# Patient Record
Sex: Male | Born: 1945 | Race: White | Hispanic: No | Smoking: Former smoker
Health system: Southern US, Community
[De-identification: ages and names within clinical notes are randomized; demographics above are authoritative.]

## PROBLEM LIST (undated history)

## (undated) DIAGNOSIS — R0600 Dyspnea, unspecified: Secondary | ICD-10-CM

## (undated) DIAGNOSIS — IMO0001 Reserved for inherently not codable concepts without codable children: Secondary | ICD-10-CM

## (undated) DIAGNOSIS — I2510 Atherosclerotic heart disease of native coronary artery without angina pectoris: Secondary | ICD-10-CM

## (undated) DIAGNOSIS — J449 Chronic obstructive pulmonary disease, unspecified: Secondary | ICD-10-CM

## (undated) DIAGNOSIS — I219 Acute myocardial infarction, unspecified: Secondary | ICD-10-CM

## (undated) DIAGNOSIS — E1169 Type 2 diabetes mellitus with other specified complication: Secondary | ICD-10-CM

## (undated) DIAGNOSIS — M5430 Sciatica, unspecified side: Secondary | ICD-10-CM

## (undated) DIAGNOSIS — I509 Heart failure, unspecified: Secondary | ICD-10-CM

## (undated) DIAGNOSIS — I10 Essential (primary) hypertension: Secondary | ICD-10-CM

## (undated) DIAGNOSIS — K219 Gastro-esophageal reflux disease without esophagitis: Secondary | ICD-10-CM

## (undated) DIAGNOSIS — R911 Solitary pulmonary nodule: Secondary | ICD-10-CM

## (undated) DIAGNOSIS — C3490 Malignant neoplasm of unspecified part of unspecified bronchus or lung: Secondary | ICD-10-CM

## (undated) DIAGNOSIS — J189 Pneumonia, unspecified organism: Secondary | ICD-10-CM

## (undated) DIAGNOSIS — W3400XA Accidental discharge from unspecified firearms or gun, initial encounter: Secondary | ICD-10-CM

## (undated) DIAGNOSIS — H539 Unspecified visual disturbance: Secondary | ICD-10-CM

## (undated) DIAGNOSIS — I499 Cardiac arrhythmia, unspecified: Secondary | ICD-10-CM

## (undated) DIAGNOSIS — R0609 Other forms of dyspnea: Secondary | ICD-10-CM

## (undated) DIAGNOSIS — E669 Obesity, unspecified: Secondary | ICD-10-CM

## (undated) DIAGNOSIS — G4730 Sleep apnea, unspecified: Secondary | ICD-10-CM

## (undated) DIAGNOSIS — I739 Peripheral vascular disease, unspecified: Secondary | ICD-10-CM

## (undated) DIAGNOSIS — M1990 Unspecified osteoarthritis, unspecified site: Secondary | ICD-10-CM

## (undated) DIAGNOSIS — R918 Other nonspecific abnormal finding of lung field: Secondary | ICD-10-CM

## (undated) DIAGNOSIS — Z9981 Dependence on supplemental oxygen: Secondary | ICD-10-CM

## (undated) DIAGNOSIS — E785 Hyperlipidemia, unspecified: Secondary | ICD-10-CM

## (undated) DIAGNOSIS — F419 Anxiety disorder, unspecified: Secondary | ICD-10-CM

## (undated) HISTORY — DX: Accidental discharge from unspecified firearms or gun, initial encounter: W34.00XA

## (undated) HISTORY — PX: CORONARY ARTERY BYPASS GRAFT: SHX141

## (undated) HISTORY — PX: OTHER SURGICAL HISTORY: SHX169

## (undated) HISTORY — DX: Unspecified visual disturbance: H53.9

## (undated) HISTORY — PX: COLON SURGERY: SHX602

## (undated) HISTORY — PX: EXPLORATORY LAPAROTOMY: SUR591

## (undated) HISTORY — PX: MANDIBLE FRACTURE SURGERY: SHX706

## (undated) HISTORY — DX: Other forms of dyspnea: R06.09

## (undated) HISTORY — DX: Malignant neoplasm of unspecified part of unspecified bronchus or lung: C34.90

## (undated) HISTORY — DX: Dyspnea, unspecified: R06.00

## (undated) HISTORY — PX: CORONARY ANGIOPLASTY: SHX604

## (undated) HISTORY — DX: Dependence on supplemental oxygen: Z99.81

## (undated) HISTORY — DX: Other nonspecific abnormal finding of lung field: R91.8

---

## 1967-05-22 DIAGNOSIS — W3400XA Accidental discharge from unspecified firearms or gun, initial encounter: Secondary | ICD-10-CM

## 1967-05-22 HISTORY — DX: Accidental discharge from unspecified firearms or gun, initial encounter: W34.00XA

## 2004-06-05 ENCOUNTER — Emergency Department: Payer: Self-pay | Admitting: Emergency Medicine

## 2005-07-05 ENCOUNTER — Other Ambulatory Visit: Payer: Self-pay

## 2005-07-05 ENCOUNTER — Emergency Department: Payer: Self-pay | Admitting: Emergency Medicine

## 2005-07-19 ENCOUNTER — Inpatient Hospital Stay: Payer: Self-pay | Admitting: Internal Medicine

## 2005-12-07 ENCOUNTER — Other Ambulatory Visit: Payer: Self-pay

## 2005-12-07 ENCOUNTER — Emergency Department: Payer: Self-pay | Admitting: Unknown Physician Specialty

## 2006-06-27 ENCOUNTER — Emergency Department: Payer: Self-pay | Admitting: General Practice

## 2006-08-16 ENCOUNTER — Encounter: Admission: RE | Admit: 2006-08-16 | Discharge: 2006-08-16 | Payer: Self-pay | Admitting: General Practice

## 2007-03-17 ENCOUNTER — Other Ambulatory Visit: Payer: Self-pay

## 2007-03-17 ENCOUNTER — Emergency Department: Payer: Self-pay | Admitting: Emergency Medicine

## 2014-10-14 ENCOUNTER — Ambulatory Visit: Payer: Medicare Other

## 2015-06-11 DIAGNOSIS — J189 Pneumonia, unspecified organism: Secondary | ICD-10-CM

## 2015-06-11 HISTORY — DX: Pneumonia, unspecified organism: J18.9

## 2015-07-17 ENCOUNTER — Encounter: Payer: Self-pay | Admitting: *Deleted

## 2015-07-17 ENCOUNTER — Emergency Department: Payer: Medicare Other

## 2015-07-17 ENCOUNTER — Inpatient Hospital Stay
Admission: EM | Admit: 2015-07-17 | Discharge: 2015-07-20 | DRG: 190 | Disposition: A | Discharge status: Home / Self Care | Payer: Medicare Other | Attending: Internal Medicine | Admitting: Internal Medicine

## 2015-07-17 DIAGNOSIS — I739 Peripheral vascular disease, unspecified: Secondary | ICD-10-CM | POA: Diagnosis present

## 2015-07-17 DIAGNOSIS — Z789 Other specified health status: Secondary | ICD-10-CM

## 2015-07-17 DIAGNOSIS — I10 Essential (primary) hypertension: Secondary | ICD-10-CM | POA: Diagnosis present

## 2015-07-17 DIAGNOSIS — Z7984 Long term (current) use of oral hypoglycemic drugs: Secondary | ICD-10-CM

## 2015-07-17 DIAGNOSIS — Z7951 Long term (current) use of inhaled steroids: Secondary | ICD-10-CM

## 2015-07-17 DIAGNOSIS — Z7952 Long term (current) use of systemic steroids: Secondary | ICD-10-CM

## 2015-07-17 DIAGNOSIS — E1151 Type 2 diabetes mellitus with diabetic peripheral angiopathy without gangrene: Secondary | ICD-10-CM | POA: Diagnosis present

## 2015-07-17 DIAGNOSIS — J440 Chronic obstructive pulmonary disease with acute lower respiratory infection: Principal | ICD-10-CM | POA: Diagnosis present

## 2015-07-17 DIAGNOSIS — R0682 Tachypnea, not elsewhere classified: Secondary | ICD-10-CM | POA: Diagnosis present

## 2015-07-17 DIAGNOSIS — E669 Obesity, unspecified: Secondary | ICD-10-CM | POA: Diagnosis present

## 2015-07-17 DIAGNOSIS — Z79899 Other long term (current) drug therapy: Secondary | ICD-10-CM

## 2015-07-17 DIAGNOSIS — I2510 Atherosclerotic heart disease of native coronary artery without angina pectoris: Secondary | ICD-10-CM | POA: Diagnosis present

## 2015-07-17 DIAGNOSIS — Z87891 Personal history of nicotine dependence: Secondary | ICD-10-CM

## 2015-07-17 DIAGNOSIS — Z6825 Body mass index (BMI) 25.0-25.9, adult: Secondary | ICD-10-CM

## 2015-07-17 DIAGNOSIS — Z888 Allergy status to other drugs, medicaments and biological substances status: Secondary | ICD-10-CM

## 2015-07-17 DIAGNOSIS — Z7982 Long term (current) use of aspirin: Secondary | ICD-10-CM

## 2015-07-17 DIAGNOSIS — R911 Solitary pulmonary nodule: Secondary | ICD-10-CM | POA: Diagnosis present

## 2015-07-17 DIAGNOSIS — J189 Pneumonia, unspecified organism: Secondary | ICD-10-CM | POA: Diagnosis present

## 2015-07-17 DIAGNOSIS — G8929 Other chronic pain: Secondary | ICD-10-CM | POA: Diagnosis present

## 2015-07-17 DIAGNOSIS — Z7902 Long term (current) use of antithrombotics/antiplatelets: Secondary | ICD-10-CM

## 2015-07-17 DIAGNOSIS — J441 Chronic obstructive pulmonary disease with (acute) exacerbation: Secondary | ICD-10-CM | POA: Diagnosis present

## 2015-07-17 DIAGNOSIS — Z951 Presence of aortocoronary bypass graft: Secondary | ICD-10-CM

## 2015-07-17 DIAGNOSIS — M5430 Sciatica, unspecified side: Secondary | ICD-10-CM | POA: Diagnosis present

## 2015-07-17 DIAGNOSIS — E785 Hyperlipidemia, unspecified: Secondary | ICD-10-CM | POA: Diagnosis present

## 2015-07-17 HISTORY — DX: Sciatica, unspecified side: M54.30

## 2015-07-17 HISTORY — DX: Peripheral vascular disease, unspecified: I73.9

## 2015-07-17 HISTORY — DX: Type 2 diabetes mellitus with other specified complication: E11.69

## 2015-07-17 HISTORY — DX: Essential (primary) hypertension: I10

## 2015-07-17 HISTORY — DX: Atherosclerotic heart disease of native coronary artery without angina pectoris: I25.10

## 2015-07-17 HISTORY — DX: Obesity, unspecified: E66.9

## 2015-07-17 HISTORY — DX: Chronic obstructive pulmonary disease, unspecified: J44.9

## 2015-07-17 LAB — BASIC METABOLIC PANEL
Anion gap: 9 (ref 5–15)
BUN: 24 mg/dL — AB (ref 6–20)
CALCIUM: 9.6 mg/dL (ref 8.9–10.3)
CO2: 26 mmol/L (ref 22–32)
Chloride: 104 mmol/L (ref 101–111)
Creatinine, Ser: 1.17 mg/dL (ref 0.61–1.24)
GFR calc Af Amer: >60 mL/min (ref >60)
Glucose, Bld: 124 mg/dL — ABNORMAL HIGH (ref 65–99)
POTASSIUM: 5.1 mmol/L (ref 3.5–5.1)
SODIUM: 139 mmol/L (ref 135–145)

## 2015-07-17 LAB — CBC
HCT: 45.5 % (ref 40.0–52.0)
HEMOGLOBIN: 15.1 g/dL (ref 13.0–18.0)
MCH: 30.4 pg (ref 26.0–34.0)
MCHC: 33.2 g/dL (ref 32.0–36.0)
MCV: 91.7 fL (ref 80.0–100.0)
PLATELETS: 286 10*3/uL (ref 150–440)
RBC: 4.96 MIL/uL (ref 4.40–5.90)
RDW: 15.3 % — AB (ref 11.5–14.5)
WBC: 6.5 10*3/uL (ref 3.8–10.6)

## 2015-07-17 LAB — TROPONIN I

## 2015-07-17 MED ORDER — PIPERACILLIN-TAZOBACTAM 3.375 G IVPB
3.3750 g | Freq: Once | INTRAVENOUS | Status: AC
  Administered 2015-07-18: 3.375 g via INTRAVENOUS
  Filled 2015-07-17: qty 50

## 2015-07-17 MED ORDER — METHYLPREDNISOLONE SODIUM SUCC 125 MG IJ SOLR
125.0000 mg | INTRAMUSCULAR | Status: AC
  Administered 2015-07-17: 125 mg via INTRAVENOUS
  Filled 2015-07-17: qty 2

## 2015-07-17 MED ORDER — IPRATROPIUM-ALBUTEROL 0.5-2.5 (3) MG/3ML IN SOLN
3.0000 mL | Freq: Once | RESPIRATORY_TRACT | Status: AC
  Administered 2015-07-17: 3 mL via RESPIRATORY_TRACT
  Filled 2015-07-17: qty 3

## 2015-07-17 MED ORDER — SODIUM CHLORIDE 0.9 % IV BOLUS (SEPSIS)
1000.0000 mL | Freq: Once | INTRAVENOUS | Status: AC
  Administered 2015-07-17: 1000 mL via INTRAVENOUS

## 2015-07-17 NOTE — ED Notes (Signed)
Pt reports shortness of breath since jan 21st.  Pt had recent pneumonia and took antibiodics x 2.  Pt reports intermittent chest pain.  Pt has nausea.    Pt alert .   Speech clear.

## 2015-07-17 NOTE — ED Provider Notes (Signed)
[facility] Emergency Department Provider Note  ____________________________________________  Time seen: Approximately 11:30 PM  I have reviewed the triage vital signs and the nursing notes.   HISTORY  Chief Complaint Shortness of Breath    HPI Jared Jefferson is a 70 y.o. male history of COPD, initially treated on 3 or 4 antibiotics for persistent cough, shortness of breath and chills and feeling warm in the evenings. Patient is unsure if he's had a fever.  Patient reports that about a month to a*with a cough while at fishing tournament in [place]. He was seen and placed on antibiotic, and follow up with his primary is placed on doxycycline, then went to the [facility] emergency room and was placed on another antibiotic which she doesn't remember the name of. He reports he took a full course of medicine he is originally given as well as doxycycline.  Continues to feel short of breath, wheezing, coughing productive at times. Occasionally feels warm and so he has a fever.  Denies chest pain. No pain with deep inspiration.  No past medical history on file.  There are no active problems to display for this patient.   No past surgical history on file.  Current Outpatient Rx  Name  Route  Sig  Dispense  Refill  . albuterol (PROVENTIL HFA;VENTOLIN HFA) 108 (90 Base) MCG/ACT inhaler   Inhalation   Inhale 1-2 puffs into the lungs every 6 (six) hours as needed for wheezing or shortness of breath.         . ALPRAZolam (XANAX) 1 MG tablet   Oral   Take 1 mg by mouth 3 (three) times daily as needed for anxiety or sleep.         Marland Kitchen aspirin EC 81 MG tablet   Oral   Take 81 mg by mouth daily.         Marland Kitchen atorvastatin (LIPITOR) 40 MG tablet   Oral   Take 40 mg by mouth daily.         . budesonide-formoterol (SYMBICORT) 160-4.5 MCG/ACT inhaler   Inhalation   Inhale 2 puffs into the lungs 2 (two) times daily.         . carisoprodol (SOMA) 350 MG tablet    Oral   Take 350 mg by mouth 3 (three) times daily.         . cilostazol (PLETAL) 50 MG tablet   Oral   Take 50 mg by mouth 2 (two) times daily.         . clopidogrel (PLAVIX) 75 MG tablet   Oral   Take 75 mg by mouth daily.         . diphenhydrAMINE (BENADRYL) 25 mg capsule   Oral   Take 75 mg by mouth at bedtime as needed for sleep.         . fexofenadine (ALLEGRA) 180 MG tablet   Oral   Take 180 mg by mouth daily.         Marland Kitchen HYDROcodone-acetaminophen (NORCO) 10-325 MG tablet   Oral   Take 1 tablet by mouth every 6 (six) hours as needed for moderate pain.         Marland Kitchen lisinopril (PRINIVIL,ZESTRIL) 10 MG tablet   Oral   Take 10 mg by mouth daily.         . meloxicam (MOBIC) 15 MG tablet   Oral   Take 15 mg by mouth daily.         . metoprolol succinate (  TOPROL-XL) 100 MG 24 hr tablet   Oral   Take 100 mg by mouth daily. Take with or immediately following a meal.         . mometasone-formoterol (DULERA) 100-5 MCG/ACT AERO   Inhalation   Inhale 2 puffs into the lungs 2 (two) times daily.         . nitroGLYCERIN (NITROSTAT) 0.4 MG SL tablet   Sublingual   Place 0.4 mg under the tongue every 5 (five) minutes as needed for chest pain.         . pioglitazone-metformin (ACTOPLUS MET) 15-850 MG tablet   Oral   Take 1 tablet by mouth 2 (two) times daily with a meal.           Allergies Iohexol  No family history on file.  Social History Social History  Substance Use Topics  . Smoking status: Former Research scientist (life sciences)  . Smokeless tobacco: Not on file  . Alcohol Use: No    Review of Systems Constitutional: History of present illness Eyes: No visual changes. ENT: No sore throat. Cardiovascular: Denies chest pain. Respiratory: History of present illness Gastrointestinal: No abdominal pain.  No nausea, no vomiting.  No diarrhea.  No constipation. Genitourinary: Negative for dysuria. Musculoskeletal: Negative for back pain. Skin: Negative for  rash. Neurological: Negative for headaches, focal weakness or numbness.  10-point ROS otherwise negative.  ____________________________________________   PHYSICAL EXAM:  VITAL SIGNS: ED Triage Vitals  Enc Vitals Group     BP 07/17/15 2002 130/71 mmHg     Pulse Rate 07/17/15 2002 52     Resp 07/17/15 2002 20     Temp 07/17/15 2002 98 F (36.7 C)     Temp Source 07/17/15 2002 Oral     SpO2 07/17/15 2002 94 %     Weight 07/17/15 2002 180 lb (81.647 kg)     Height 07/17/15 2002 _0  (1.778 m)     Head Cir --      Peak Flow --      Pain Score 07/17/15 2003 8     Pain Loc --      Pain Edu? --      Excl. in [place]? --    Constitutional: Alert and oriented. Well appearing and in no acute distress. Eyes: Conjunctivae are normal. PERRL. EOMI. Head: Atraumatic. Nose: No congestion/rhinnorhea. Mouth/Throat: Mucous membranes are moist.  Oropharynx non-erythematous. Neck: No stridor.   Cardiovascular: Normal rate, regular rhythm. Grossly normal heart sounds.  Good peripheral circulation. Respiratory: Frequent dry cough. Occasionally audible wheezing with speaking. Speaks in phrases. Mild use of accessory muscles, and expiratory wheezing heard in all lobes. No significant distress other than mild increased work of breathing is noted at this time. Gastrointestinal: Soft and nontender. No distention. No abdominal bruits. No CVA tenderness. Musculoskeletal: No lower extremity tenderness nor edema.  Neurologic:  Normal speech and language. No gross focal neurologic deficits are appreciated.  Skin:  Skin is warm, dry and intact. No rash noted. Psychiatric: Mood and affect are normal. Speech and behavior are normal.  ____________________________________________   LABS (all labs ordered are listed, but only abnormal results are displayed)  Labs Reviewed  BASIC METABOLIC PANEL - Abnormal; Notable for the following:    Glucose, Bld 124 (*)    BUN 24 (*)    All other components within normal  limits  CBC - Abnormal; Notable for the following:    RDW 15.3 (*)    All other components within normal limits  CULTURE,  BLOOD (ROUTINE X 2)  CULTURE, BLOOD (ROUTINE X 2)  TROPONIN I   ____________________________________________  EKG  Reviewed and interpreted by me at 2015 Ventricular rate 70 QRS 105 QTc 420 Irregular rhythm, but appears to be normal sinus without ischemic change. Normal T waves. Patient sinus arrhythmia. ____________________________________________  RADIOLOGY  CT Chest Wo Contrast (Final result) Result time: 07/17/15 22:15:51   Final result by Rad Results In Interface (07/17/15 22:15:51)   Narrative:   CLINICAL DATA: 70 year old male with shortness of breath. History of recent pneumonia. Patient reports intubated chest pain.  EXAM: CT CHEST WITHOUT CONTRAST  TECHNIQUE: Multidetector CT imaging of the chest was performed following the standard protocol without IV contrast.  COMPARISON: Chest radiograph dated 07/17/2015  FINDINGS: There is emphysematous changes of the lungs. There is diffuse interstitial prominence involving the entire right lung concerning for pneumonia. There is a 9 mm nodular density in the right upper lobe posteriorly (series 2, image 16). The left lung is clear. There is no pleural effusion or pneumothorax. The central airways are patent. There is narrowing of the transverse diameter of the trachea (saber sheath trachea) compatible with chronic obstructive lung disease. There is no hilar or mediastinal adenopathy. There is atherosclerotic calcification of the thoracic aorta. There is no cardiomegaly or pericardial effusion. Advanced coronary vascular calcification as well as CABG changes. Esophagus is grossly unremarkable.  There is no axillary adenopathy. The chest wall soft tissues appear unremarkable. Median sternotomy wires. There is degenerative changes of the spine. No acute fracture.  There is layering small  stones within the gallbladder. No pericholecystic fluid. The visualized upper abdomen is otherwise unremarkable.  IMPRESSION: Emphysema with diffuse interstitial thickening of the right lung concerning for pneumonia.  A 9 mm right upper lobe pulmonary nodule. As per Fleischner Society criteria Follow-up with CT at 3, 9, and 24 months or dynamic contrast-enhanced CT, PET, and/or biopsy recommended.   Electronically Signed By: Anner Crete M.D. On: 07/17/2015 22:15    ____________________________________________   PROCEDURES  Procedure(s) performed: None  Critical Care performed: No  ____________________________________________   INITIAL IMPRESSION / ASSESSMENT AND PLAN / ED COURSE  Pertinent labs & imaging results that were available during my care of the patient were reviewed by me and considered in my medical decision making (see chart for details).  Patient transfer persistent cough, wheezing, and productive cough not improving after at least 2 focal courses of antibiotics in the last month.  His labs are reassuring but CT does demonstrate a persistent infiltrate in given his cough, wheezing, and infectious symptoms is possible he has a resistant infection. Alternative considerations would certainly include pulmonary embolism, the patient has IV dye allergy. He has no persistent or pleuritic pain. Denies chest pain. No previous history of any blood clots. No leg swelling or symptoms to suggest obvious DVT. Based on his current presentation feel warranted to admit for IV antibiotics, close follow-up and pulmonary consultation at this time. Patient is agreeable.   ____________________________________________   FINAL CLINICAL IMPRESSION(S) / ED DIAGNOSES  Final diagnoses:  Community acquired pneumonia  Failure of outpatient treatment  COPD exacerbation ([place])      Delman Kitten, MD 07/17/15 2335

## 2015-07-18 ENCOUNTER — Encounter: Payer: Self-pay | Admitting: Internal Medicine

## 2015-07-18 DIAGNOSIS — I739 Peripheral vascular disease, unspecified: Secondary | ICD-10-CM | POA: Diagnosis present

## 2015-07-18 DIAGNOSIS — I2510 Atherosclerotic heart disease of native coronary artery without angina pectoris: Secondary | ICD-10-CM | POA: Diagnosis present

## 2015-07-18 DIAGNOSIS — Z888 Allergy status to other drugs, medicaments and biological substances status: Secondary | ICD-10-CM | POA: Diagnosis not present

## 2015-07-18 DIAGNOSIS — Z7984 Long term (current) use of oral hypoglycemic drugs: Secondary | ICD-10-CM | POA: Diagnosis not present

## 2015-07-18 DIAGNOSIS — R911 Solitary pulmonary nodule: Secondary | ICD-10-CM | POA: Diagnosis present

## 2015-07-18 DIAGNOSIS — J441 Chronic obstructive pulmonary disease with (acute) exacerbation: Secondary | ICD-10-CM | POA: Diagnosis present

## 2015-07-18 DIAGNOSIS — Z87891 Personal history of nicotine dependence: Secondary | ICD-10-CM | POA: Diagnosis not present

## 2015-07-18 DIAGNOSIS — Z7982 Long term (current) use of aspirin: Secondary | ICD-10-CM | POA: Diagnosis not present

## 2015-07-18 DIAGNOSIS — E669 Obesity, unspecified: Secondary | ICD-10-CM | POA: Diagnosis present

## 2015-07-18 DIAGNOSIS — R0682 Tachypnea, not elsewhere classified: Secondary | ICD-10-CM | POA: Diagnosis present

## 2015-07-18 DIAGNOSIS — J189 Pneumonia, unspecified organism: Secondary | ICD-10-CM | POA: Diagnosis present

## 2015-07-18 DIAGNOSIS — Z7902 Long term (current) use of antithrombotics/antiplatelets: Secondary | ICD-10-CM | POA: Diagnosis not present

## 2015-07-18 DIAGNOSIS — Z951 Presence of aortocoronary bypass graft: Secondary | ICD-10-CM | POA: Diagnosis not present

## 2015-07-18 DIAGNOSIS — I10 Essential (primary) hypertension: Secondary | ICD-10-CM | POA: Diagnosis present

## 2015-07-18 DIAGNOSIS — Z7951 Long term (current) use of inhaled steroids: Secondary | ICD-10-CM | POA: Diagnosis not present

## 2015-07-18 DIAGNOSIS — Z79899 Other long term (current) drug therapy: Secondary | ICD-10-CM | POA: Diagnosis not present

## 2015-07-18 DIAGNOSIS — J440 Chronic obstructive pulmonary disease with acute lower respiratory infection: Secondary | ICD-10-CM | POA: Diagnosis present

## 2015-07-18 DIAGNOSIS — E785 Hyperlipidemia, unspecified: Secondary | ICD-10-CM | POA: Diagnosis present

## 2015-07-18 DIAGNOSIS — Z7952 Long term (current) use of systemic steroids: Secondary | ICD-10-CM | POA: Diagnosis not present

## 2015-07-18 DIAGNOSIS — E1151 Type 2 diabetes mellitus with diabetic peripheral angiopathy without gangrene: Secondary | ICD-10-CM | POA: Diagnosis present

## 2015-07-18 DIAGNOSIS — Z6825 Body mass index (BMI) 25.0-25.9, adult: Secondary | ICD-10-CM | POA: Diagnosis not present

## 2015-07-18 DIAGNOSIS — M5430 Sciatica, unspecified side: Secondary | ICD-10-CM | POA: Diagnosis present

## 2015-07-18 DIAGNOSIS — G8929 Other chronic pain: Secondary | ICD-10-CM | POA: Diagnosis present

## 2015-07-18 LAB — GLUCOSE, CAPILLARY
Glucose-Capillary: 142 mg/dL — ABNORMAL HIGH (ref 65–99)
Glucose-Capillary: 191 mg/dL — ABNORMAL HIGH (ref 65–99)
Glucose-Capillary: 197 mg/dL — ABNORMAL HIGH (ref 65–99)
Glucose-Capillary: 143 mg/dL — ABNORMAL HIGH (ref 65–99)
Glucose-Capillary: 224 mg/dL — ABNORMAL HIGH (ref 65–99)

## 2015-07-18 LAB — HEMOGLOBIN A1C: Hgb A1c MFr Bld: 6.5 % — ABNORMAL HIGH (ref 4.0–6.0)

## 2015-07-18 LAB — TSH: TSH: 2.357 u[IU]/mL (ref 0.350–4.500)

## 2015-07-18 MED ORDER — LORATADINE 10 MG PO TABS
10.0000 mg | ORAL_TABLET | Freq: Every day | ORAL | Status: DC
  Administered 2015-07-18 – 2015-07-20 (×3): 10 mg via ORAL
  Filled 2015-07-18 (×3): qty 1

## 2015-07-18 MED ORDER — NITROGLYCERIN 0.4 MG SL SUBL: 0.4000 mg | SUBLINGUAL_TABLET | SUBLINGUAL | Status: DC | PRN

## 2015-07-18 MED ORDER — ALPRAZOLAM 0.5 MG PO TABS
1.0000 mg | ORAL_TABLET | Freq: Three times a day (TID) | ORAL | Status: DC | PRN
Start: 2015-07-18 — End: 2015-07-20
  Administered 2015-07-18 – 2015-07-20 (×3): 1 mg via ORAL
  Filled 2015-07-18: qty 1
  Filled 2015-07-18 (×2): qty 2
  Filled 2015-07-18: qty 1

## 2015-07-18 MED ORDER — TIOTROPIUM BROMIDE MONOHYDRATE 18 MCG IN CAPS
18.0000 ug | ORAL_CAPSULE | Freq: Every day | RESPIRATORY_TRACT | Status: DC
  Administered 2015-07-18 – 2015-07-20 (×3): 18 ug via RESPIRATORY_TRACT
  Filled 2015-07-18: qty 5

## 2015-07-18 MED ORDER — INSULIN ASPART 100 UNIT/ML ~~LOC~~ SOLN
0.0000 [IU] | Freq: Every day | SUBCUTANEOUS | Status: DC
  Administered 2015-07-18: 2 [IU] via SUBCUTANEOUS
  Filled 2015-07-18: qty 2

## 2015-07-18 MED ORDER — ENOXAPARIN SODIUM 40 MG/0.4ML ~~LOC~~ SOLN
40.0000 mg | SUBCUTANEOUS | Status: DC
  Administered 2015-07-18 – 2015-07-19 (×2): 40 mg via SUBCUTANEOUS
  Filled 2015-07-18 (×2): qty 0.4

## 2015-07-18 MED ORDER — CARISOPRODOL 350 MG PO TABS
350.0000 mg | ORAL_TABLET | Freq: Three times a day (TID) | ORAL | Status: DC
  Administered 2015-07-18 – 2015-07-20 (×7): 350 mg via ORAL
  Filled 2015-07-18 (×7): qty 1

## 2015-07-18 MED ORDER — MELOXICAM 7.5 MG PO TABS
15.0000 mg | ORAL_TABLET | Freq: Every day | ORAL | Status: DC
  Administered 2015-07-18 – 2015-07-20 (×3): 15 mg via ORAL
  Filled 2015-07-18 (×3): qty 2

## 2015-07-18 MED ORDER — PREDNISONE 20 MG PO TABS: 20.0000 mg | ORAL_TABLET | Freq: Every day | ORAL | Status: DC

## 2015-07-18 MED ORDER — MORPHINE SULFATE (PF) 2 MG/ML IV SOLN: 2.0000 mg | INTRAVENOUS | Status: DC | PRN

## 2015-07-18 MED ORDER — HEPARIN SODIUM (PORCINE) 5000 UNIT/ML IJ SOLN
5000.0000 [IU] | Freq: Three times a day (TID) | INTRAMUSCULAR | Status: DC
  Administered 2015-07-18: 5000 [IU] via SUBCUTANEOUS
  Filled 2015-07-18: qty 1

## 2015-07-18 MED ORDER — DOCUSATE SODIUM 100 MG PO CAPS
100.0000 mg | ORAL_CAPSULE | Freq: Two times a day (BID) | ORAL | Status: DC
  Administered 2015-07-18 – 2015-07-20 (×6): 100 mg via ORAL
  Filled 2015-07-18 (×6): qty 1

## 2015-07-18 MED ORDER — PREDNISONE 50 MG PO TABS
50.0000 mg | ORAL_TABLET | Freq: Every day | ORAL | Status: AC
  Administered 2015-07-18: 50 mg via ORAL
  Filled 2015-07-18: qty 1

## 2015-07-18 MED ORDER — CILOSTAZOL 100 MG PO TABS
50.0000 mg | ORAL_TABLET | Freq: Two times a day (BID) | ORAL | Status: DC
  Administered 2015-07-18 – 2015-07-20 (×6): 50 mg via ORAL
  Filled 2015-07-18: qty 2
  Filled 2015-07-18 (×5): qty 1

## 2015-07-18 MED ORDER — METOPROLOL SUCCINATE ER 100 MG PO TB24
100.0000 mg | ORAL_TABLET | Freq: Every day | ORAL | Status: DC
  Administered 2015-07-18 – 2015-07-20 (×3): 100 mg via ORAL
  Filled 2015-07-18 (×3): qty 1

## 2015-07-18 MED ORDER — INSULIN ASPART 100 UNIT/ML ~~LOC~~ SOLN
0.0000 [IU] | Freq: Three times a day (TID) | SUBCUTANEOUS | Status: DC
  Administered 2015-07-18 (×2): 2 [IU] via SUBCUTANEOUS
  Administered 2015-07-18: 1 [IU] via SUBCUTANEOUS
  Administered 2015-07-19: 3 [IU] via SUBCUTANEOUS
  Administered 2015-07-19: 1 [IU] via SUBCUTANEOUS
  Administered 2015-07-19 – 2015-07-20 (×3): 2 [IU] via SUBCUTANEOUS
  Filled 2015-07-18: qty 3
  Filled 2015-07-18: qty 1
  Filled 2015-07-18 (×3): qty 2
  Filled 2015-07-18: qty 1
  Filled 2015-07-18 (×2): qty 2

## 2015-07-18 MED ORDER — ATORVASTATIN CALCIUM 20 MG PO TABS
40.0000 mg | ORAL_TABLET | Freq: Every day | ORAL | Status: DC
  Administered 2015-07-18 – 2015-07-20 (×3): 40 mg via ORAL
  Filled 2015-07-18 (×3): qty 2

## 2015-07-18 MED ORDER — PREDNISONE 20 MG PO TABS: 10.0000 mg | ORAL_TABLET | Freq: Every day | ORAL | Status: DC

## 2015-07-18 MED ORDER — PREDNISONE 20 MG PO TABS
40.0000 mg | ORAL_TABLET | Freq: Every day | ORAL | Status: AC
  Administered 2015-07-19: 40 mg via ORAL
  Filled 2015-07-18: qty 2

## 2015-07-18 MED ORDER — PREDNISONE 20 MG PO TABS
30.0000 mg | ORAL_TABLET | Freq: Every day | ORAL | Status: AC
  Administered 2015-07-20: 30 mg via ORAL
  Filled 2015-07-18: qty 2

## 2015-07-18 MED ORDER — ALBUTEROL SULFATE (2.5 MG/3ML) 0.083% IN NEBU: 3.0000 mL | INHALATION_SOLUTION | RESPIRATORY_TRACT | Status: DC | PRN

## 2015-07-18 MED ORDER — PREDNISONE 5 MG PO TABS: 5.0000 mg | ORAL_TABLET | Freq: Every day | ORAL | Status: DC

## 2015-07-18 MED ORDER — ASPIRIN EC 81 MG PO TBEC
81.0000 mg | DELAYED_RELEASE_TABLET | Freq: Every day | ORAL | Status: DC
  Administered 2015-07-18 – 2015-07-20 (×3): 81 mg via ORAL
  Filled 2015-07-18 (×3): qty 1

## 2015-07-18 MED ORDER — ONDANSETRON HCL 4 MG PO TABS: 4.0000 mg | ORAL_TABLET | Freq: Four times a day (QID) | ORAL | Status: DC | PRN

## 2015-07-18 MED ORDER — CLOPIDOGREL BISULFATE 75 MG PO TABS
75.0000 mg | ORAL_TABLET | Freq: Every day | ORAL | Status: DC
Start: 2015-07-18 — End: 2015-07-20
  Administered 2015-07-18 – 2015-07-20 (×3): 75 mg via ORAL
  Filled 2015-07-18 (×3): qty 1

## 2015-07-18 MED ORDER — ONDANSETRON HCL 4 MG/2ML IJ SOLN
4.0000 mg | Freq: Four times a day (QID) | INTRAMUSCULAR | Status: DC | PRN
Start: 2015-07-18 — End: 2015-07-20

## 2015-07-18 MED ORDER — MOMETASONE FURO-FORMOTEROL FUM 100-5 MCG/ACT IN AERO: 2.0000 | INHALATION_SPRAY | Freq: Two times a day (BID) | RESPIRATORY_TRACT | Status: DC

## 2015-07-18 MED ORDER — PIPERACILLIN-TAZOBACTAM 3.375 G IVPB
3.3750 g | Freq: Three times a day (TID) | INTRAVENOUS | Status: DC
  Administered 2015-07-18 – 2015-07-20 (×6): 3.375 g via INTRAVENOUS
  Filled 2015-07-18 (×10): qty 50

## 2015-07-18 MED ORDER — MOMETASONE FURO-FORMOTEROL FUM 200-5 MCG/ACT IN AERO
2.0000 | INHALATION_SPRAY | Freq: Two times a day (BID) | RESPIRATORY_TRACT | Status: DC
  Administered 2015-07-18 – 2015-07-19 (×2): 2 via RESPIRATORY_TRACT
  Filled 2015-07-18: qty 8.8

## 2015-07-18 MED ORDER — DIPHENHYDRAMINE HCL 25 MG PO CAPS: 75.0000 mg | ORAL_CAPSULE | Freq: Every evening | ORAL | Status: DC | PRN

## 2015-07-18 MED ORDER — ACETAMINOPHEN 325 MG PO TABS: 650.0000 mg | ORAL_TABLET | Freq: Four times a day (QID) | ORAL | Status: DC | PRN

## 2015-07-18 MED ORDER — HYDROCODONE-ACETAMINOPHEN 10-325 MG PO TABS
1.0000 | ORAL_TABLET | Freq: Four times a day (QID) | ORAL | Status: DC | PRN
  Administered 2015-07-18 – 2015-07-20 (×7): 1 via ORAL
  Filled 2015-07-18 (×8): qty 1

## 2015-07-18 MED ORDER — LISINOPRIL 10 MG PO TABS
10.0000 mg | ORAL_TABLET | Freq: Every day | ORAL | Status: DC
  Administered 2015-07-18: 10 mg via ORAL
  Filled 2015-07-18: qty 1

## 2015-07-18 MED ORDER — ACETAMINOPHEN 650 MG RE SUPP: 650.0000 mg | Freq: Four times a day (QID) | RECTAL | Status: DC | PRN

## 2015-07-18 NOTE — Progress Notes (Signed)
Pharmacy Antibiotic Note  Jared Jefferson is a 70 y.o. male admitted on 07/17/2015 with pneumonia.  Pharmacy has been consulted for Zosyn dosing.  Plan: Zosyn 3.375g IV q8h (4 hour infusion).  Height: '5\' 10"'$  (177.8 cm) Weight: 173 lb 9.6 oz (78.744 kg) IBW/kg (Calculated) : 73  Temp (24hrs), Avg:97.8 F (36.6 C), Min:97.3 F (36.3 C), Max:98 F (36.7 C)   Recent Labs Lab 07/17/15 2006  WBC 6.5  CREATININE 1.17    Estimated Creatinine Clearance: 61.5 mL/min (by C-G formula based on Cr of 1.17).    Allergies  Allergen Reactions  . Iohexol Anaphylaxis    Antimicrobials this admission: Zosyn 2/26>>   Microbiology results: 2/26 BCx: pending  Thank you for allowing pharmacy to be a part of this patient's care.  Chastelyn Athens G 07/18/2015 11:25 AM

## 2015-07-18 NOTE — Care Management Note (Signed)
Case Management Note  Patient Details  Name: YAEL ANGERER MRN: [id number] Date of Birth: [birth year]  Subjective/Objective:                  Met with patient to discuss discharge planning. He states that he is from home alone where he is independent with daily activities- he drives. He states his cousins are his only kin in the area and one is named [name] that is a Research scientist (medical) here with [facility]. He states he recently bought a friends mobile home that came with O2 tanks, concentrator, O2 that can be carried on your shoulder, nebulizer, and hospital bed although he states he is not using O2 as it has not been prescribed. His PCP is Dr. Petra Kuba in [place]. His pharmacy is [facility]. He denies difficulty obtaining Rx.   Action/Plan:   No current RNCM needs.   Expected Discharge Date:                  Expected Discharge Plan:     In-House Referral:     Discharge planning Services  CM Consult  Post Acute Care Choice:    Choice offered to:  Patient  DME Arranged:    DME Agency:     HH Arranged:    [place] Agency:     Status of Service:  Completed, signed off  Medicare Important Message Given:    Date Medicare IM Given:    Medicare IM give by:    Date Additional Medicare IM Given:    Additional Medicare Important Message give by:     If discussed at [place] of Stay Meetings, dates discussed:    Additional Comments:  [name], RN 07/18/2015, 12:51 PM

## 2015-07-18 NOTE — H&P (Signed)
Jared Jefferson is an 70 y.o. male.   Chief Complaint: Shortness of breath HPI: The patient presents to the emergency department complaining of cough, wheezing, and dyspnea.  He has had these symptoms for 1 month.  He has completed 3 rounds of antibiotics (and 1 very long steroid taper) without significant relief.  Upon arrival, his oxygen saturations were normal on room air but he was tachypneic.  Chest xray showed acute on chronic right middle lobe pneumonia.  Due to failed outpatient treatment the emergency department staff called for admission.  Past Medical History  Diagnosis Date  . COPD (chronic obstructive pulmonary disease) (HCC)   . Essential hypertension   . Sciatica     right side  . Diabetes mellitus type 2 in obese (HCC)   . CAD (coronary artery disease)   . PAD (peripheral artery disease) [facility])     Past Surgical History  Procedure Laterality Date  . Exploratory laparotomy    . Coronary artery bypass graft      Family History  Problem Relation Age of Onset  . CAD Father    Social History:  reports that he has quit smoking. He does not have any smokeless tobacco history on file. He reports that he does not drink alcohol. His drug history is not on file.  Allergies:  Allergies  Allergen Reactions  . Iohexol Anaphylaxis    Medications Prior to Admission  Medication Sig Dispense Refill  . albuterol (PROVENTIL HFA;VENTOLIN HFA) 108 (90 Base) MCG/ACT inhaler Inhale 1-2 puffs into the lungs every 6 (six) hours as needed for wheezing or shortness of breath.    . ALPRAZolam (XANAX) 1 MG tablet Take 1 mg by mouth 3 (three) times daily as needed for anxiety or sleep.    Marland Kitchen aspirin EC 81 MG tablet Take 81 mg by mouth daily.    Marland Kitchen atorvastatin (LIPITOR) 40 MG tablet Take 40 mg by mouth daily.    . budesonide-formoterol (SYMBICORT) 160-4.5 MCG/ACT inhaler Inhale 2 puffs into the lungs 2 (two) times daily.    . carisoprodol (SOMA) 350 MG tablet Take 350 mg by mouth 3 (three) times  daily.    . cilostazol (PLETAL) 50 MG tablet Take 50 mg by mouth 2 (two) times daily.    . clopidogrel (PLAVIX) 75 MG tablet Take 75 mg by mouth daily.    . diphenhydrAMINE (BENADRYL) 25 mg capsule Take 75 mg by mouth at bedtime as needed for sleep.    . fexofenadine (ALLEGRA) 180 MG tablet Take 180 mg by mouth daily.    Marland Kitchen HYDROcodone-acetaminophen (NORCO) 10-325 MG tablet Take 1 tablet by mouth every 6 (six) hours as needed for moderate pain.    Marland Kitchen lisinopril (PRINIVIL,ZESTRIL) 10 MG tablet Take 10 mg by mouth daily.    . meloxicam (MOBIC) 15 MG tablet Take 15 mg by mouth daily.    . metoprolol succinate (TOPROL-XL) 100 MG 24 hr tablet Take 100 mg by mouth daily. Take with or immediately following a meal.    . mometasone-formoterol (DULERA) 100-5 MCG/ACT AERO Inhale 2 puffs into the lungs 2 (two) times daily.    . nitroGLYCERIN (NITROSTAT) 0.4 MG SL tablet Place 0.4 mg under the tongue every 5 (five) minutes as needed for chest pain.    . pioglitazone-metformin (ACTOPLUS MET) 15-850 MG tablet Take 1 tablet by mouth 2 (two) times daily with a meal.      Results for orders placed or performed during the hospital encounter of 07/17/15 (from the  past 48 hour(s))  Basic metabolic panel     Status: Abnormal   Collection Time: 07/17/15  8:06 PM  Result Value Ref Range   Sodium 139 135 - 145 mmol/L   Potassium 5.1 3.5 - 5.1 mmol/L   Chloride 104 101 - 111 mmol/L   CO2 26 22 - 32 mmol/L   Glucose, Bld 124 (H) 65 - 99 mg/dL   BUN 24 (H) 6 - 20 mg/dL   Creatinine, Ser 1.17 0.61 - 1.24 mg/dL   Calcium 9.6 8.9 - 10.3 mg/dL   GFR calc non Af Amer >60 >60 mL/min   GFR calc Af Amer >60 >60 mL/min    Comment: (NOTE) The eGFR has been calculated using the CKD EPI equation. This calculation has not been validated in all clinical situations. eGFR's persistently <60 mL/min signify possible Chronic Kidney Disease.    Anion gap 9 5 - 15  Troponin I     Status: None   Collection Time: 07/17/15  8:06 PM   Result Value Ref Range   Troponin I <0.03 <0.031 ng/mL    Comment:        NO INDICATION OF MYOCARDIAL INJURY.   CBC     Status: Abnormal   Collection Time: 07/17/15  8:06 PM  Result Value Ref Range   WBC 6.5 3.8 - 10.6 K/uL   RBC 4.96 4.40 - 5.90 MIL/uL   Hemoglobin 15.1 13.0 - 18.0 g/dL   HCT 45.5 40.0 - 52.0 %   MCV 91.7 80.0 - 100.0 fL   MCH 30.4 26.0 - 34.0 pg   MCHC 33.2 32.0 - 36.0 g/dL   RDW 15.3 (H) 11.5 - 14.5 %   Platelets 286 150 - 440 K/uL  TSH     Status: None   Collection Time: 07/17/15  8:06 PM  Result Value Ref Range   TSH 2.357 0.350 - 4.500 uIU/mL  Glucose, capillary     Status: Abnormal   Collection Time: 07/18/15  2:03 AM  Result Value Ref Range   Glucose-Capillary 142 (H) 65 - 99 mg/dL   Comment 1 Notify RN    Dg Chest 2 View  07/17/2015  CLINICAL DATA:  Bilateral chest pain EXAM: CHEST  2 VIEW COMPARISON:  03/17/2007 FINDINGS: Cardiac shadow is within normal limits. Postsurgical changes are again seen. Mild increased interstitial changes are noted with mild superimposed right middle lobe infiltrate. No acute bony abnormality is seen. IMPRESSION: Acute on chronic right middle lobe infiltrate. Electronically Signed   By: Inez Catalina M.D.   On: 07/17/2015 21:09   Ct Chest Wo Contrast  07/17/2015  CLINICAL DATA:  70 year old male with shortness of breath. History of recent pneumonia. Patient reports intubated chest pain. EXAM: CT CHEST WITHOUT CONTRAST TECHNIQUE: Multidetector CT imaging of the chest was performed following the standard protocol without IV contrast. COMPARISON:  Chest radiograph dated 07/17/2015 FINDINGS: There is emphysematous changes of the lungs. There is diffuse interstitial prominence involving the entire right lung concerning for pneumonia. There is a 9 mm nodular density in the right upper lobe posteriorly (series 2, image 16). The left lung is clear. There is no pleural effusion or pneumothorax. The central airways are patent. There is  narrowing of the transverse diameter of the trachea (saber sheath trachea) compatible with chronic obstructive lung disease. There is no hilar or mediastinal adenopathy. There is atherosclerotic calcification of the thoracic aorta. There is no cardiomegaly or pericardial effusion. Advanced coronary vascular calcification as well as CABG changes.  Esophagus is grossly unremarkable. There is no axillary adenopathy. The chest wall soft tissues appear unremarkable. Median sternotomy wires. There is degenerative changes of the spine. No acute fracture. There is layering small stones within the gallbladder. No pericholecystic fluid. The visualized upper abdomen is otherwise unremarkable. IMPRESSION: Emphysema with diffuse interstitial thickening of the right lung concerning for pneumonia. A 9 mm right upper lobe pulmonary nodule. As per Fleischner Society criteria Follow-up with CT at 3, 9, and 24 months or dynamic contrast-enhanced CT, PET, and/or biopsy recommended. Electronically Signed   By: Anner Crete M.D.   On: 07/17/2015 22:15    Review of Systems  Constitutional: Negative for fever and chills.  HENT: Negative for sore throat and tinnitus.   Eyes: Negative for blurred vision and redness.  Respiratory: Positive for cough, sputum production, shortness of breath and wheezing.   Cardiovascular: Negative for chest pain, palpitations, orthopnea and PND.  Gastrointestinal: Negative for nausea, vomiting, abdominal pain and diarrhea.  Genitourinary: Negative for dysuria, urgency and frequency.  Musculoskeletal: Negative for myalgias and joint pain.  Skin: Negative for rash.       No lesions  Neurological: Negative for speech change, focal weakness and weakness.  Endo/Heme/Allergies: Does not bruise/bleed easily.       No temperature intolerance  Psychiatric/Behavioral: Negative for depression and suicidal ideas.    Blood pressure 118/65, pulse 80, temperature 97.7 F (36.5 C), temperature source  Oral, resp. rate 20, height '5\' 10"'$  (1.778 m), weight 78.744 kg (173 lb 9.6 oz), SpO2 94 %. Physical Exam  Nursing note and vitals reviewed. Constitutional: He is oriented to person, place, and time. He appears well-developed and well-nourished. No distress.  HENT:  Head: Normocephalic and atraumatic.  Mouth/Throat: Oropharynx is clear and moist.  Eyes: Conjunctivae and EOM are normal. Pupils are equal, round, and reactive to light. No scleral icterus.  Neck: Normal range of motion. Neck supple. No JVD present. No tracheal deviation present. No thyromegaly present.  Cardiovascular: Normal rate, regular rhythm and normal heart sounds.  Exam reveals no gallop and no friction rub.   No murmur heard. Respiratory: Effort normal. He has wheezes.  GI: Soft. Bowel sounds are normal. He exhibits no distension. There is no tenderness.  Genitourinary:  Deferred  Musculoskeletal: Normal range of motion. He exhibits no edema.  Lymphadenopathy:    He has no cervical adenopathy.  Neurological: He is alert and oriented to person, place, and time.  Skin: Skin is warm and dry. No rash noted. No erythema.  Psychiatric: He has a normal mood and affect. His behavior is normal. Judgment and thought content normal.     Assessment/Plan This is a 70 year old male with COPD admitted for persistent tachypnea and shortness of breath secondary to pneumonia. 1. Pneumonia: community acquired; he has failed outpatient treatment.  The patient received Zosyn in the emergency department.  May switch to Augmentin and Azithromycin (he has been on a flouroquinolone).  He received Solumedrol on the ED and I will repeat a steroid taper.  The patient is having a hard time clearing his pneumonia so I have ordered a pulmonology consult.  He does not meet septic criteria.   2. COPD: likely exacerbating pneumonia.  Restart Spiriva.  Continue inhalers per home regimen. 3. CAD: stable; continue aspirin and Plavix 4. Diabetes mellitus  type 2: sliding scale insulin 5. Essential hypertension: continue metoprolol and lisinopril 6. PAD: continue Pletal 7. Hyperlipidemia: continue statin  8. Chronic pain: continue Soma and Mobic 9.  DVT prophylaxis: heparin 10: GI prophylaxis: none The patient is a full code.  Time spent on admission orders and patient care approximately 45 minutes.   Harrie Foreman, MD 07/18/2015, 5:22 AM

## 2015-07-19 LAB — BASIC METABOLIC PANEL
ANION GAP: 5 (ref 5–15)
BUN: 23 mg/dL — ABNORMAL HIGH (ref 6–20)
CHLORIDE: 108 mmol/L (ref 101–111)
CO2: 23 mmol/L (ref 22–32)
Calcium: 8.4 mg/dL — ABNORMAL LOW (ref 8.9–10.3)
Creatinine, Ser: 0.92 mg/dL (ref 0.61–1.24)
GFR calc Af Amer: >60 mL/min (ref >60)
GLUCOSE: 187 mg/dL — AB (ref 65–99)
POTASSIUM: 4.5 mmol/L (ref 3.5–5.1)
Sodium: 136 mmol/L (ref 135–145)

## 2015-07-19 LAB — GLUCOSE, CAPILLARY
Glucose-Capillary: 143 mg/dL — ABNORMAL HIGH (ref 65–99)
Glucose-Capillary: 204 mg/dL — ABNORMAL HIGH (ref 65–99)
Glucose-Capillary: 199 mg/dL — ABNORMAL HIGH (ref 65–99)
Glucose-Capillary: 173 mg/dL — ABNORMAL HIGH (ref 65–99)

## 2015-07-19 NOTE — Progress Notes (Signed)
[place] [facility] [place] NAME: Jared Jefferson    MR#:  [id number]  DATE OF BIRTH:  1945-09-23  SUBJECTIVE:  CHIEF COMPLAINT:   Chief Complaint  Patient presents with  . Shortness of Breath   Continues to feel weak and fatigued. Ongoing wheezing. Not needing oxygen. Afebrile.Shortness of breath.  REVIEW OF SYSTEMS:    Review of Systems  Constitutional: Positive for malaise/fatigue. Negative for fever and chills.  HENT: Negative for sore throat.   Eyes: Negative for blurred vision, double vision and pain.  Respiratory: Positive for cough, sputum production, shortness of breath and wheezing. Negative for hemoptysis.   Cardiovascular: Negative for chest pain, palpitations, orthopnea and leg swelling.  Gastrointestinal: Negative for heartburn, nausea, vomiting, abdominal pain, diarrhea and constipation.  Genitourinary: Negative for dysuria and hematuria.  Musculoskeletal: Negative for back pain and joint pain.  Skin: Negative for rash.  Neurological: Positive for weakness. Negative for sensory change, speech change, focal weakness and headaches.  Endo/Heme/Allergies: Does not bruise/bleed easily.  Psychiatric/Behavioral: Negative for depression. The patient is not nervous/anxious.     DRUG ALLERGIES:   Allergies  Allergen Reactions  . Iohexol Anaphylaxis    VITALS:  Blood pressure 143/61, pulse 144, temperature 98.3 F (36.8 C), temperature source Oral, resp. rate 18, height '5\' 10"'$  (1.778 m), weight 82.318 kg (181 lb 7.7 oz), SpO2 95 %.  PHYSICAL EXAMINATION:   Physical Exam  GENERAL:  70 y.o.-year-old patient lying in the bed with no acute distress. Decreased hearing EYES: Pupils equal, round, reactive to light and accommodation. No scleral icterus. Extraocular muscles intact.  HEENT: Head atraumatic, normocephalic. Oropharynx and nasopharynx clear.  NECK:  Supple, no jugular venous distention. No thyroid enlargement, no  tenderness.  LUNGS: Bilateral wheezing with poor air entry CARDIOVASCULAR: S1, S2 normal. No murmurs, rubs, or gallops.  ABDOMEN: Soft, nontender, nondistended. Bowel sounds present. No organomegaly or mass.  EXTREMITIES: No cyanosis, clubbing or edema b/l.    NEUROLOGIC: Cranial nerves II through XII are intact. No focal Motor or sensory deficits b/l.   PSYCHIATRIC: The patient is alert and oriented x 3.  SKIN: No obvious rash, lesion, or ulcer.   LABORATORY PANEL:   CBC  Recent Labs Lab 07/17/15 2006  WBC 6.5  HGB 15.1  HCT 45.5  PLT 286   ------------------------------------------------------------------------------------------------------------------ Chemistries   Recent Labs Lab 07/19/15 0527  NA 136  K 4.5  CL 108  CO2 23  GLUCOSE 187*  BUN 23*  CREATININE 0.92  CALCIUM 8.4*   ------------------------------------------------------------------------------------------------------------------  Cardiac Enzymes  Recent Labs Lab 07/17/15 2006  TROPONINI <0.03   ------------------------------------------------------------------------------------------------------------------  RADIOLOGY:  Dg Chest 2 View  07/17/2015  CLINICAL DATA:  Bilateral chest pain EXAM: CHEST  2 VIEW COMPARISON:  03/17/2007 FINDINGS: Cardiac shadow is within normal limits. Postsurgical changes are again seen. Mild increased interstitial changes are noted with mild superimposed right middle lobe infiltrate. No acute bony abnormality is seen. IMPRESSION: Acute on chronic right middle lobe infiltrate. Electronically Signed   By: Inez Catalina M.D.   On: 07/17/2015 21:09   Ct Chest Wo Contrast  07/17/2015  CLINICAL DATA:  70 year old male with shortness of breath. History of recent pneumonia. Patient reports intubated chest pain. EXAM: CT CHEST WITHOUT CONTRAST TECHNIQUE: Multidetector CT imaging of the chest was performed following the standard protocol without IV contrast. COMPARISON:  Chest  radiograph dated 07/17/2015 FINDINGS: There is emphysematous changes of the lungs. There is diffuse interstitial prominence involving  the entire right lung concerning for pneumonia. There is a 9 mm nodular density in the right upper lobe posteriorly (series 2, image 16). The left lung is clear. There is no pleural effusion or pneumothorax. The central airways are patent. There is narrowing of the transverse diameter of the trachea (saber sheath trachea) compatible with chronic obstructive lung disease. There is no hilar or mediastinal adenopathy. There is atherosclerotic calcification of the thoracic aorta. There is no cardiomegaly or pericardial effusion. Advanced coronary vascular calcification as well as CABG changes. Esophagus is grossly unremarkable. There is no axillary adenopathy. The chest wall soft tissues appear unremarkable. Median sternotomy wires. There is degenerative changes of the spine. No acute fracture. There is layering small stones within the gallbladder. No pericholecystic fluid. The visualized upper abdomen is otherwise unremarkable. IMPRESSION: Emphysema with diffuse interstitial thickening of the right lung concerning for pneumonia. A 9 mm right upper lobe pulmonary nodule. As per Fleischner Society criteria Follow-up with CT at 3, 9, and 24 months or dynamic contrast-enhanced CT, PET, and/or biopsy recommended. Electronically Signed   By: Anner Crete M.D.   On: 07/17/2015 22:15     ASSESSMENT AND PLAN:   This is a 70 year old male with COPD admitted for persistent tachypnea and shortness of breath secondary to pneumonia.  * right-sided community acquired pneumonia with failed outpatient treatment and COPD exacerbation Continues to have significant wheezing. -IV steroids, Antibiotics - Scheduled Nebulizers - Inhalers - Consult pulmonary  * CAD: stable; continue aspirin and Plavix  * Diabetes mellitus type 2: sliding scale insulin  * Essential hypertension continue  metoprolol Stop lisinopril due to low normal blood pressure  * PAD Pletal  * Hyperlipidemia: continue statin   * Chronic pain: continue Soma and Mobic  All the records are reviewed and case discussed with Care Management/Social Workerr. Management plans discussed with the patient, family and they are in agreement.  CODE STATUS: FULL  DVT Prophylaxis: SCDs  TOTAL TIME TAKING CARE OF THIS PATIENT: 35 minutes.   POSSIBLE D/C IN 1-2 DAYS, DEPENDING ON CLINICAL CONDITION.  Hillary Bow R M.D on 07/19/2015 at 1:04 PM  Between 7am to 6pm - Pager - [phone]  After 6pm go to [email or web address] - password [email or web address] [facility]  Office  [phone]  CC: Primary care physician; Pcp Not In System  Note: This dictation was prepared with Dragon dictation along with smaller phrase technology. Any transcriptional errors that result from this process are unintentional.

## 2015-07-19 NOTE — Progress Notes (Signed)
Date: 07/19/2015,   MRN# [id number] [address] April 23, 1946 Code Status:     Code Status Orders        Start     Ordered   07/18/15 0140  Full code   Continuous     07/18/15 0139    Code Status History    Date Active Date Inactive Code Status Order ID Comments User Context   This patient has a current code status but no historical code status.     Hosp day:'@LENGTHOFSTAYDAYS'$ @ Referring MD: '@ATDPROV'$ @      BJ:YNWG exacerbation   HPI: This is a 70 yr old ex smoker who comes in with copd exacerbation. Saw him today, he is an ex smoker, still wheezing but better since here. No chest pain, ectopy, syncope, hemoptysis, leg pain or worsening sob. The latter is better, able to ambulate around the nursing station.   PMHX:   Past Medical History  Diagnosis Date  . COPD (chronic obstructive pulmonary disease) ([place])   . Essential hypertension   . Sciatica     right side  . Diabetes mellitus type 2 in obese ([place])   . CAD (coronary artery disease)   . PAD (peripheral artery disease) ([place])    Surgical Hx:  Past Surgical History  Procedure Laterality Date  . Exploratory laparotomy    . Coronary artery bypass graft     Family Hx:  Family History  Problem Relation Age of Onset  . CAD Father    Social Hx:   Social History  Substance Use Topics  . Smoking status: Former Research scientist (life sciences)  . Smokeless tobacco: None  . Alcohol Use: No   Medication:    Home Medication:  No current outpatient prescriptions on file.  Current Medication: '@CURMEDTAB'$ @   Allergies:  Iohexol  Review of Systems: Gen:  Denies  fever, sweats, chills HEENT: Denies blurred vision, double vision, ear pain, eye pain, hearing loss, nose bleeds, sore throat Cvc:  No dizziness, chest pain or heaviness Resp:  Less sob, wheezing and coughing.   Gi: Denies swallowing difficulty, stomach pain, nausea or vomiting, diarrhea, constipation, bowel incontinence Gu:  Denies bladder incontinence, burning urine Ext:   No Joint  pain, stiffness or swelling Skin: No skin rash, easy bruising or bleeding or hives Endoc:  No polyuria, polydipsia , polyphagia or weight change Psych: No depression, insomnia or hallucinations  Other:  All other systems negative  Physical Examination:   VS: BP 143/61 mmHg  Pulse 144  Temp(Src) 98.3 F (36.8 C) (Oral)  Resp 18  Ht '5\' 10"'$  (1.778 m)  Wt 181 lb 7.7 oz (82.318 kg)  BMI 26.04 kg/m2  SpO2 95%  General Appearance: No distress  Neuro: without focal findings, mental status, speech normal, alert and oriented, cranial nerves 2-12 intact, reflexes normal and symmetric, sensation grossly normal  HEENT: PERRLA, EOM intact, no ptosis, no other lesions noticed, Mallampati: Pulmonary:.Exparatory wheezing, no rub   Cardiovascular:  Normal S1,S2.  No m/r/g.   Abdomen:Benign, Soft, non-tender, No masses, hepatosplenomegaly, No lymphadenopathy Endoc: No evident thyromegaly, no signs of acromegaly or Cushing features Skin:   warm, no rashes, no ecchymosis  Extremities: normal, no cyanosis, clubbing, no edema, warm with normal capillary refill. Other findings:   Labs results:   Recent Labs     07/17/15  2006  07/19/15  0527  HGB  15.1   --   HCT  45.5   --   MCV  91.7   --   WBC  6.5   --  BUN  24*  23*  CREATININE  1.17  0.92  GLUCOSE  124*  187*  CALCIUM  9.6  8.4*  ,   Rad results:  IMPRESSION: Emphysema with diffuse interstitial thickening of the right lung concerning for pneumonia.  A 9 mm right upper lobe pulmonary nodule. As per Fleischner Society criteria Follow-up with CT at 3, 9, and 24 months or dynamic contrast-enhanced CT, PET, and/or biopsy recommended.   Electronically Signed  By: Anner Crete M.D.  On: 07/17/2015 22:15  Assessment and Plan: Copd/emphysema with on going dyspnea and slowly improving bronchospasm -solumedrol transition to prednisone -laba, ics, spiriva, albuterol -rest and exercise o2 sats prior to d/c -dvt  prophylaxis -out patient pfts -stay off cigarettes  Right pneumonia -agree with antbiotic coverage  Nine mm right upper lobe pulm nodule in a recent smoker -repeat chest ct in 3 months   I have personally obtained a history, examined the patient, evaluated laboratory and imaging results, formulated the assessment and plan and placed orders.  The Patient requires high complexity decision making for assessment and support, frequent evaluation and titration of therapies, application of advanced monitoring technologies and extensive interpretation of multiple databases.   Zyann Mabry,M.D. Pulmonary & Critical care Medicine [facility]

## 2015-07-20 LAB — GLUCOSE, CAPILLARY
Glucose-Capillary: 158 mg/dL — ABNORMAL HIGH (ref 65–99)
Glucose-Capillary: 151 mg/dL — ABNORMAL HIGH (ref 65–99)

## 2015-07-20 MED ORDER — AMOXICILLIN-POT CLAVULANATE 875-125 MG PO TABS: 1.0000 | ORAL_TABLET | Freq: Two times a day (BID) | ORAL | Status: DC

## 2015-07-20 MED ORDER — PREDNISONE 10 MG (21) PO TBPK: ORAL_TABLET | ORAL | Status: DC

## 2015-07-20 MED ORDER — TIOTROPIUM BROMIDE MONOHYDRATE 18 MCG IN CAPS: 18.0000 ug | ORAL_CAPSULE | Freq: Every day | RESPIRATORY_TRACT | Status: DC

## 2015-07-20 NOTE — Progress Notes (Addendum)
Discharge instructions given per MD order, home and new medications reviewed, Rx.slip's given to patient.  Patient voiced understanding of discharge instructions given.  Waiting for ride home. Discharge via wheelchair.

## 2015-07-20 NOTE — Progress Notes (Signed)
Date: 07/20/2015,   MRN# [id number] Trystin [address] [place] [birth year] Code Status:     Code Status Orders        Start     Ordered   07/18/15 0140  Full code   Continuous     07/18/15 0139    Code Status History    Date Active Date Inactive Code Status Order ID Comments User Context   This patient has a current code status but no historical code status.     Hosp day:'@LENGTHOFSTAYDAYS'$ @ Referring MD: '@ATDPROV'$ @      HPI: Today he is less wheezing, less sob, no calf pain. D/c plans in progress.  PMHX:   Past Medical History  Diagnosis Date  . COPD (chronic obstructive pulmonary disease) ([place])   . Essential hypertension   . Sciatica     right side  . Diabetes mellitus type 2 in obese ([place])   . CAD (coronary artery disease)   . PAD (peripheral artery disease) ([place])    Surgical Hx:  Past Surgical History  Procedure Laterality Date  . Exploratory laparotomy    . Coronary artery bypass graft     Family Hx:  Family History  Problem Relation Age of Onset  . CAD Father    Social Hx:   Social History  Substance Use Topics  . Smoking status: Former Research scientist (life sciences)  . Smokeless tobacco: None  . Alcohol Use: No   Medication:    Home Medication:  Current Outpatient Rx  Name  Route  Sig  Dispense  Refill  . amoxicillin-clavulanate (AUGMENTIN) 875-125 MG tablet   Oral   Take 1 tablet by mouth 2 (two) times daily.   14 tablet   0   . predniSONE (STERAPRED UNI-PAK 21 TAB) 10 MG (21) TBPK tablet      Take 6 tabs first day, 5 tab on day 2, then 4 on day 3rd, 3 tabs on day 4th , 2 tab on day 5th, and 1 tab on 6th day.   21 tablet   0   . tiotropium (SPIRIVA) 18 MCG inhalation capsule   Inhalation   Place 1 capsule (18 mcg total) into inhaler and inhale daily.   30 capsule   0     Current Medication: '@CURMEDTAB'$ @   Allergies:  Iohexol  Review of Systems: Gen:  Denies  fever, sweats, chills HEENT: Denies blurred vision, double vision, ear pain, eye pain, hearing loss, nose bleeds,  sore throat Cvc:  No dizziness, chest pain or heaviness Resp:   Less wheezing and sob Gi: Denies swallowing difficulty, stomach pain, nausea or vomiting, diarrhea, constipation, bowel incontinence Gu:  Denies bladder incontinence, burning urine Ext:   No Joint pain, stiffness or swelling Skin: No skin rash, easy bruising or bleeding or hives Endoc:  No polyuria, polydipsia , polyphagia or weight change Psych: No depression, insomnia or hallucinations  Other:  All other systems negative  Physical Examination:   VS: BP 143/65 mmHg  Pulse 64  Temp(Src) 97.8 F (36.6 C) (Oral)  Resp 16  Ht '5\' 10"'$  (1.778 m)  Wt 185 lb 15.8 oz (84.363 kg)  BMI 26.69 kg/m2  SpO2 98%  General Appearance: No distress  Neuro: without focal findings, mental status, speech normal, alert and oriented, cranial nerves 2-12 intact, reflexes normal and symmetric, sensation grossly normal  HEENT: PERRLA, EOM intact, no ptosis, no other lesions noticed, Mallampati: Pulmonary:.less  wheezing, No rales  Sputum Production:   Cardiovascular:  Normal S1,S2.  No m/r/g.  Abdominal aorta  pulsation normal.    Abdomen:Benign, Soft, non-tender, No masses, hepatosplenomegaly, No lymphadenopathy Endoc: No evident thyromegaly, no signs of acromegaly or Cushing features Skin:   warm, no rashes, no ecchymosis  Extremities: normal, no cyanosis, clubbing, no edema, warm with normal capillary refill. Other findings:   Labs results:   Recent Labs     07/17/15  2006  07/19/15  0527  HGB  15.1   --   HCT  45.5   --   MCV  91.7   --   WBC  6.5   --   BUN  24*  23*  CREATININE  1.17  0.92  GLUCOSE  124*  187*  CALCIUM  9.6  8.4*  ,    Assessment and Plan: Copd/emphysema with on going dyspnea and slowly improving bronchospasm -prednisone taper -laba, ics, spiriva, albuterol -rest and exercise o2 sats prior to d/c -dvt prophylaxis -out patient pfts -stay off cigarettes  Right pneumonia -agree with antbiotic  coverage  Nine mm right upper lobe pulm nodule in a recent smoker -repeat chest ct in 3 months    I have personally obtained a history, examined the patient, evaluated laboratory and imaging results, formulated the assessment and plan and placed orders.  The Patient requires high complexity decision making for assessment and support, frequent evaluation and titration of therapies, application of advanced monitoring technologies and extensive interpretation of multiple databases.   Ayah Cozzolino,M.D. Pulmonary & Critical care Medicine [facility]

## 2015-07-20 NOTE — Discharge Instructions (Signed)
Need a follow up CT scan of chest- to follow on lung nodule- in 3  Months.

## 2015-07-20 NOTE — Progress Notes (Addendum)
Patient's up and ambulating in hallway, gait steady, less short of breath with ambulation.

## 2015-07-20 NOTE — Care Management Important Message (Signed)
Important Message  Patient Details  Name: Jared Jefferson MRN: [id number] Date of Birth: 08/31/1945   Medicare Important Message Given:  Yes    Juliann Pulse A Taniqua Issa 07/20/2015, 9:48 AM

## 2015-07-22 LAB — CULTURE, BLOOD (ROUTINE X 2)
CULTURE: NO GROWTH
CULTURE: NO GROWTH

## 2015-07-23 NOTE — Discharge Summary (Signed)
[facility] - [place] at [facility]   PATIENT NAME: Jared Jefferson    MR#:  [id number]  DATE OF BIRTH:  1945-07-29  DATE OF ADMISSION:  07/17/2015 ADMITTING PHYSICIAN: Arnaldo Natal, MD  DATE OF DISCHARGE: 07/20/2015  2:48 PM  PRIMARY CARE PHYSICIAN: Pcp Not In System    ADMISSION DIAGNOSIS:  Community acquired pneumonia [J18.9] COPD exacerbation (HCC) [J44.1] Failure of outpatient treatment [Z78.9]  DISCHARGE DIAGNOSIS:  Active Problems:   Tachypnea   Pneumonia   SECONDARY DIAGNOSIS:   Past Medical History  Diagnosis Date  . COPD (chronic obstructive pulmonary disease) (HCC)   . Essential hypertension   . Sciatica     right side  . Diabetes mellitus type 2 in obese (HCC)   . CAD (coronary artery disease)   . PAD (peripheral artery disease) (HCC)     HOSPITAL COURSE:   1. Pneumonia: community acquired; he has failed outpatient treatment. The patient received Zosyn in the emergency department. D/c on Augmentin. 2. COPD: likely exacerbating pneumonia. Restart Spiriva. Continue inhalers per home regimen. 3. CAD: stable; continue aspirin and Plavix 4. Diabetes mellitus type 2: sliding scale insulin 5. Essential hypertension: continue metoprolol and lisinopril 6. PAD: continue Pletal 7. Hyperlipidemia: continue statin  8. Chronic pain: continue Soma and Mobic 9. DVT prophylaxis: heparin 10: GI prophylaxis: none  DISCHARGE CONDITIONS:   Stable.  CONSULTS OBTAINED:     DRUG ALLERGIES:   Allergies  Allergen Reactions  . Iohexol Anaphylaxis    DISCHARGE MEDICATIONS:   Discharge Medication List as of 07/20/2015  2:05 PM    START taking these medications   Details  amoxicillin-clavulanate (AUGMENTIN) 875-125 MG tablet Take 1 tablet by mouth 2 (two) times daily., Starting 07/20/2015, Until Discontinued, Print    predniSONE (STERAPRED UNI-PAK 21 TAB) 10 MG (21) TBPK tablet Take 6 tabs first day, 5 tab on day 2, then 4 on day 3rd, 3  tabs on day 4th , 2 tab on day 5th, and 1 tab on 6th day., Print    tiotropium (SPIRIVA) 18 MCG inhalation capsule Place 1 capsule (18 mcg total) into inhaler and inhale daily., Starting 07/20/2015, Until Discontinued, Print      CONTINUE these medications which have NOT CHANGED   Details  albuterol (PROVENTIL HFA;VENTOLIN HFA) 108 (90 Base) MCG/ACT inhaler Inhale 1-2 puffs into the lungs every 6 (six) hours as needed for wheezing or shortness of breath., Until Discontinued, Historical Med    ALPRAZolam (XANAX) 1 MG tablet Take 1 mg by mouth 3 (three) times daily as needed for anxiety or sleep., Until Discontinued, Historical Med    aspirin EC 81 MG tablet Take 81 mg by mouth daily., Until Discontinued, Historical Med    atorvastatin (LIPITOR) 40 MG tablet Take 40 mg by mouth daily., Until Discontinued, Historical Med    budesonide-formoterol (SYMBICORT) 160-4.5 MCG/ACT inhaler Inhale 2 puffs into the lungs 2 (two) times daily., Until Discontinued, Historical Med    carisoprodol (SOMA) 350 MG tablet Take 350 mg by mouth 3 (three) times daily., Until Discontinued, Historical Med    cilostazol (PLETAL) 50 MG tablet Take 50 mg by mouth 2 (two) times daily., Until Discontinued, Historical Med    clopidogrel (PLAVIX) 75 MG tablet Take 75 mg by mouth daily., Until Discontinued, Historical Med    diphenhydrAMINE (BENADRYL) 25 mg capsule Take 75 mg by mouth at bedtime as needed for sleep., Until Discontinued, Historical Med    fexofenadine (ALLEGRA) 180 MG tablet Take 180 mg  by mouth daily., Until Discontinued, Historical Med    HYDROcodone-acetaminophen (NORCO) 10-325 MG tablet Take 1 tablet by mouth every 6 (six) hours as needed for moderate pain., Until Discontinued, Historical Med    lisinopril (PRINIVIL,ZESTRIL) 10 MG tablet Take 10 mg by mouth daily., Until Discontinued, Historical Med    meloxicam (MOBIC) 15 MG tablet Take 15 mg by mouth daily., Until Discontinued, Historical Med     metoprolol succinate (TOPROL-XL) 100 MG 24 hr tablet Take 100 mg by mouth daily. Take with or immediately following a meal., Until Discontinued, Historical Med    mometasone-formoterol (DULERA) 100-5 MCG/ACT AERO Inhale 2 puffs into the lungs 2 (two) times daily., Until Discontinued, Historical Med    nitroGLYCERIN (NITROSTAT) 0.4 MG SL tablet Place 0.4 mg under the tongue every 5 (five) minutes as needed for chest pain., Until Discontinued, Historical Med    pioglitazone-metformin (ACTOPLUS MET) 15-850 MG tablet Take 1 tablet by mouth 2 (two) times daily with a meal., Until Discontinued, Historical Med         DISCHARGE INSTRUCTIONS:    Follow with PMD in 2 weeks.  If you experience worsening of your admission symptoms, develop shortness of breath, life threatening emergency, suicidal or homicidal thoughts you must seek medical attention immediately by calling 911 or calling your MD immediately  if symptoms less severe.  You Must read complete instructions/literature along with all the possible adverse reactions/side effects for all the Medicines you take and that have been prescribed to you. Take any new Medicines after you have completely understood and accept all the possible adverse reactions/side effects.   Please note  You were cared for by a hospitalist during your hospital stay. If you have any questions about your discharge medications or the care you received while you were in the hospital after you are discharged, you can call the unit and asked to speak with the hospitalist on call if the hospitalist that took care of you is not available. Once you are discharged, your primary care physician will handle any further medical issues. Please note that NO REFILLS for any discharge medications will be authorized once you are discharged, as it is imperative that you return to your primary care physician (or establish a relationship with a primary care physician if you do not have one)  for your aftercare needs so that they can reassess your need for medications and monitor your lab values.    Today   CHIEF COMPLAINT:   Chief Complaint  Patient presents with  . Shortness of Breath    HISTORY OF PRESENT ILLNESS:  Finnbar Cedillos  is a 70 y.o. male with a known history of cough, wheezing, and dyspnea. He has had these symptoms for 1 month. He has completed 3 rounds of antibiotics (and 1 very long steroid taper) without significant relief. Upon arrival, his oxygen saturations were normal on room air but he was tachypneic. Chest xray showed acute on chronic right middle lobe pneumonia. Due to failed outpatient treatment the emergency department staff called for admission.   VITAL SIGNS:  Blood pressure 141/66, pulse 58, temperature 97.9 F (36.6 C), temperature source Oral, resp. rate 20, height '5\' 10"'$  (1.778 m), weight 84.363 kg (185 lb 15.8 oz), SpO2 99 %.  I/O:  No intake or output data in the 24 hours ending 07/23/15 1508  PHYSICAL EXAMINATION:   GENERAL: 70 y.o.-year-old patient lying in the bed with no acute distress. Decreased hearing EYES: Pupils equal, round, reactive to light and  accommodation. No scleral icterus. Extraocular muscles intact.  HEENT: Head atraumatic, normocephalic. Oropharynx and nasopharynx clear.  NECK: Supple, no jugular venous distention. No thyroid enlargement, no tenderness.  LUNGS: Bilateral wheezing with poor air entry CARDIOVASCULAR: S1, S2 normal. No murmurs, rubs, or gallops.  ABDOMEN: Soft, nontender, nondistended. Bowel sounds present. No organomegaly or mass.  EXTREMITIES: No cyanosis, clubbing or edema b/l.  NEUROLOGIC: Cranial nerves II through XII are intact. No focal Motor or sensory deficits b/l.  PSYCHIATRIC: The patient is alert and oriented x 3.  SKIN: No obvious rash, lesion, or ulcer.   DATA REVIEW:   CBC  Recent Labs Lab 07/17/15 2006  WBC 6.5  HGB 15.1  HCT 45.5  PLT 286    Chemistries    Recent Labs Lab 07/19/15 0527  NA 136  K 4.5  CL 108  CO2 23  GLUCOSE 187*  BUN 23*  CREATININE 0.92  CALCIUM 8.4*    Cardiac Enzymes  Recent Labs Lab 07/17/15 2006  TROPONINI <0.03    Microbiology Results  Results for orders placed or performed during the hospital encounter of 07/17/15  Culture, blood (Routine X 2) w Reflex to ID Panel     Status: None   Collection Time: 07/17/15  9:46 PM  Result Value Ref Range Status   Specimen Description BLOOD RIGHT BRACHIAL ARTERY  Final   Special Requests   Final    BOTTLES DRAWN AEROBIC AND ANAEROBIC [place]   Culture NO GROWTH 5 DAYS  Final   Report Status 07/22/2015 FINAL  Final  Culture, blood (Routine X 2) w Reflex to ID Panel     Status: None   Collection Time: 07/17/15  9:46 PM  Result Value Ref Range Status   Specimen Description BLOOD LEFT ARM  Final   Special Requests   Final    BOTTLES DRAWN AEROBIC AND ANAEROBIC [place]   Culture NO GROWTH 5 DAYS  Final   Report Status 07/22/2015 FINAL  Final    RADIOLOGY:  No results found.  EKG:   Orders placed or performed during the hospital encounter of 07/17/15  . ED EKG within 10 minutes  . ED EKG within 10 minutes  . EKG 12-Lead  . EKG 12-Lead  . EKG      Management plans discussed with the patient, family and they are in agreement.  CODE STATUS:  Code Status History    Date Active Date Inactive Code Status Order ID Comments User Context   07/18/2015  1:40 AM 07/20/2015  6:40 PM Full Code [id number]  Harrie Foreman, MD Inpatient      TOTAL TIME TAKING CARE OF THIS PATIENT: 35 minutes.    Vaughan Basta M.D on 07/23/2015 at 3:08 PM  Between 7am to 6pm - Pager - [phone]  After 6pm go to [email or web address] - password [email or web address] [place] [facility]  Office  [phone]  CC: Primary care physician; Pcp Not In System   Note: This dictation was prepared with Dragon dictation along with smaller phrase technology. Any  transcriptional errors that result from this process are unintentional.

## 2015-08-03 ENCOUNTER — Other Ambulatory Visit: Payer: Self-pay | Admitting: Specialist

## 2015-08-03 DIAGNOSIS — R911 Solitary pulmonary nodule: Secondary | ICD-10-CM

## 2015-08-08 ENCOUNTER — Ambulatory Visit: Admission: RE | Admit: 2015-08-08 | Payer: Medicare Other | Source: Ambulatory Visit

## 2015-08-12 ENCOUNTER — Ambulatory Visit
Admission: RE | Admit: 2015-08-12 | Discharge: 2015-08-12 | Disposition: A | Discharge status: Home / Self Care | Payer: Medicare Other | Source: Ambulatory Visit | Attending: Specialist | Admitting: Specialist

## 2015-08-12 DIAGNOSIS — R590 Localized enlarged lymph nodes: Secondary | ICD-10-CM | POA: Insufficient documentation

## 2015-08-12 DIAGNOSIS — R918 Other nonspecific abnormal finding of lung field: Secondary | ICD-10-CM | POA: Insufficient documentation

## 2015-08-12 DIAGNOSIS — R911 Solitary pulmonary nodule: Secondary | ICD-10-CM | POA: Diagnosis not present

## 2015-08-12 LAB — GLUCOSE, CAPILLARY: GLUCOSE-CAPILLARY: 75 mg/dL (ref 65–99)

## 2015-08-12 MED ORDER — FLUDEOXYGLUCOSE F - 18 (FDG) INJECTION
12.1300 | Freq: Once | INTRAVENOUS | Status: AC | PRN
  Administered 2015-08-12: 12.13 via INTRAVENOUS

## 2015-08-23 ENCOUNTER — Inpatient Hospital Stay: Admission: RE | Admit: 2015-08-23 | Payer: Medicare Other | Source: Ambulatory Visit

## 2015-08-26 ENCOUNTER — Encounter
Admission: RE | Admit: 2015-08-26 | Discharge: 2015-08-26 | Disposition: A | Discharge status: Home / Self Care | Payer: Medicare Other | Source: Ambulatory Visit | Attending: Internal Medicine | Admitting: Internal Medicine

## 2015-08-26 DIAGNOSIS — Z01812 Encounter for preprocedural laboratory examination: Secondary | ICD-10-CM | POA: Diagnosis present

## 2015-08-26 DIAGNOSIS — R911 Solitary pulmonary nodule: Secondary | ICD-10-CM | POA: Insufficient documentation

## 2015-08-26 HISTORY — DX: Anxiety disorder, unspecified: F41.9

## 2015-08-26 HISTORY — DX: Unspecified osteoarthritis, unspecified site: M19.90

## 2015-08-26 HISTORY — DX: Heart failure, unspecified: I50.9

## 2015-08-26 HISTORY — DX: Acute myocardial infarction, unspecified: I21.9

## 2015-08-26 HISTORY — DX: Gastro-esophageal reflux disease without esophagitis: K21.9

## 2015-08-26 HISTORY — DX: Solitary pulmonary nodule: R91.1

## 2015-08-26 HISTORY — DX: Hyperlipidemia, unspecified: E78.5

## 2015-08-26 HISTORY — DX: Pneumonia, unspecified organism: J18.9

## 2015-08-26 HISTORY — DX: Reserved for inherently not codable concepts without codable children: IMO0001

## 2015-08-26 HISTORY — DX: Cardiac arrhythmia, unspecified: I49.9

## 2015-08-26 HISTORY — DX: Sleep apnea, unspecified: G47.30

## 2015-08-26 LAB — CBC
HEMATOCRIT: 40.4 % (ref 40.0–52.0)
HEMOGLOBIN: 13.6 g/dL (ref 13.0–18.0)
MCH: 29.7 pg (ref 26.0–34.0)
MCHC: 33.7 g/dL (ref 32.0–36.0)
MCV: 88.3 fL (ref 80.0–100.0)
Platelets: 334 10*3/uL (ref 150–440)
RBC: 4.57 MIL/uL (ref 4.40–5.90)
RDW: 15.4 % — AB (ref 11.5–14.5)
WBC: 9.9 10*3/uL (ref 3.8–10.6)

## 2015-08-26 LAB — BASIC METABOLIC PANEL
ANION GAP: 5 (ref 5–15)
BUN: 21 mg/dL — ABNORMAL HIGH (ref 6–20)
CO2: 28 mmol/L (ref 22–32)
Calcium: 9.1 mg/dL (ref 8.9–10.3)
Chloride: 102 mmol/L (ref 101–111)
Creatinine, Ser: 1.06 mg/dL (ref 0.61–1.24)
GLUCOSE: 90 mg/dL (ref 65–99)
POTASSIUM: 5 mmol/L (ref 3.5–5.1)
Sodium: 135 mmol/L (ref 135–145)

## 2015-08-26 LAB — DIFFERENTIAL
BASOS ABS: 0.1 10*3/uL (ref 0–0.1)
BASOS PCT: 1 %
Eosinophils Absolute: 0.4 10*3/uL (ref 0–0.7)
Eosinophils Relative: 4 %
LYMPHS ABS: 1.5 10*3/uL (ref 1.0–3.6)
Lymphocytes Relative: 15 %
MONOS PCT: 10 %
Monocytes Absolute: 1 10*3/uL (ref 0.2–1.0)
NEUTROS ABS: 6.9 10*3/uL — AB (ref 1.4–6.5)
Neutrophils Relative %: 70 %

## 2015-08-26 NOTE — Pre-Procedure Instructions (Signed)
Dr. Ronelle Nigh made aware of pre-op EKG and EKG done on July 17, 2015 , and stated that " patient should be ok".

## 2015-08-26 NOTE — Patient Instructions (Signed)
  Your procedure is scheduled on  August 31, 2015 (Wednesday) Report to Day Surgery.[facility]) Second Floor To find out your arrival time please call [phone] between 1PM - 3PM on August 30, 2015 (Tuesday).  Remember: Instructions that are not followed completely may result in serious medical risk, up to and including death, or upon the discretion of your surgeon and anesthesiologist your surgery may need to be rescheduled.    __x__ 1. Do not eat food or drink liquids after midnight. No gum chewing or hard candies.     __x_ 2. No Alcohol for 24 hours before or after surgery.   ____ 3. Bring all medications with you on the day of surgery if instructed.    __x__ 4. Notify your doctor if there is any change in your medical condition     (cold, fever, infections).     Do not wear jewelry, make-up, hairpins, clips or nail polish.  Do not wear lotions, powders, or perfumes. You may wear deodorant.  Do not shave 48 hours prior to surgery. Men may shave face and neck.  Do not bring valuables to the hospital.    [facility] is not responsible for any belongings or valuables.               Contacts, dentures or bridgework may not be worn into surgery.  Leave your suitcase in the car. After surgery it may be brought to your room.  For patients admitted to the hospital, discharge time is determined by your                treatment team.   Patients discharged the day of surgery will not be allowed to drive home.   Please read over the following fact sheets that you were given:   Surgical Site Infection Prevention   __x_ Take these medicines the morning of surgery with A SIP OF WATER:    1. Lisinopril  2. Metoprolol  3. Atorvastatin  4.  5.  6.  ____ Fleet Enema (as directed)   ____ Use CHG Soap as directed  _x___ Use inhalers on the day of surgery (Use Albuterol nebulizer, Symbicort  Spiriva, and Dulera inhalers the morning of surgery and bring to hospital)  _x__ Stop  metformin 2 days prior to surgery (Stop Pioglitazone-Metformin on  April 10)   ____ Take 1/2 of usual insulin dose the night before surgery and none on the morning of surgery.   __x__ Stop Coumadin/Plavix/aspirin on(  Patient has stopped Plavix on April 6) Call office to find out if you need to stop  cilostazol and Aspirin)  __x__ Stop Anti-inflammatories on (Stop Meloxicam now) NO NSAIDS)  ____ Stop supplements until after surgery.    ____ Bring C-Pap to the hospital.

## 2015-08-30 NOTE — Pre-Procedure Instructions (Signed)
Dr. Kayleen Memos reviewed confirmed copy of EKG and stated patient should be ok for surgery.

## 2015-08-31 ENCOUNTER — Encounter: Admission: RE | Payer: Self-pay | Source: Ambulatory Visit

## 2015-08-31 ENCOUNTER — Ambulatory Visit: Admission: RE | Admit: 2015-08-31 | Payer: Medicare Other | Source: Ambulatory Visit | Admitting: Internal Medicine

## 2015-08-31 SURGERY — ENDOBRONCHIAL ULTRASOUND (EBUS)
Anesthesia: General

## 2015-09-13 ENCOUNTER — Ambulatory Visit: Payer: Medicare Other | Admitting: Anesthesiology

## 2015-09-13 ENCOUNTER — Encounter
Admission: RE | Disposition: A | Discharge status: Home / Self Care | Payer: Self-pay | Source: Ambulatory Visit | Attending: Internal Medicine

## 2015-09-13 ENCOUNTER — Encounter: Payer: Self-pay | Admitting: Anesthesiology

## 2015-09-13 ENCOUNTER — Ambulatory Visit
Admission: RE | Admit: 2015-09-13 | Discharge: 2015-09-13 | Disposition: A | Discharge status: Home / Self Care | Payer: Medicare Other | Source: Ambulatory Visit | Attending: Internal Medicine | Admitting: Internal Medicine

## 2015-09-13 DIAGNOSIS — Z77090 Contact with and (suspected) exposure to asbestos: Secondary | ICD-10-CM | POA: Diagnosis not present

## 2015-09-13 DIAGNOSIS — Z7982 Long term (current) use of aspirin: Secondary | ICD-10-CM | POA: Insufficient documentation

## 2015-09-13 DIAGNOSIS — R05 Cough: Secondary | ICD-10-CM | POA: Diagnosis not present

## 2015-09-13 DIAGNOSIS — M545 Low back pain: Secondary | ICD-10-CM | POA: Insufficient documentation

## 2015-09-13 DIAGNOSIS — Z87891 Personal history of nicotine dependence: Secondary | ICD-10-CM | POA: Diagnosis not present

## 2015-09-13 DIAGNOSIS — R079 Chest pain, unspecified: Secondary | ICD-10-CM | POA: Diagnosis not present

## 2015-09-13 DIAGNOSIS — M1990 Unspecified osteoarthritis, unspecified site: Secondary | ICD-10-CM | POA: Insufficient documentation

## 2015-09-13 DIAGNOSIS — Z9049 Acquired absence of other specified parts of digestive tract: Secondary | ICD-10-CM | POA: Diagnosis not present

## 2015-09-13 DIAGNOSIS — I2510 Atherosclerotic heart disease of native coronary artery without angina pectoris: Secondary | ICD-10-CM | POA: Diagnosis not present

## 2015-09-13 DIAGNOSIS — Z79899 Other long term (current) drug therapy: Secondary | ICD-10-CM | POA: Diagnosis not present

## 2015-09-13 DIAGNOSIS — R591 Generalized enlarged lymph nodes: Secondary | ICD-10-CM | POA: Insufficient documentation

## 2015-09-13 DIAGNOSIS — Z7984 Long term (current) use of oral hypoglycemic drugs: Secondary | ICD-10-CM | POA: Diagnosis not present

## 2015-09-13 DIAGNOSIS — Z8249 Family history of ischemic heart disease and other diseases of the circulatory system: Secondary | ICD-10-CM | POA: Insufficient documentation

## 2015-09-13 DIAGNOSIS — R911 Solitary pulmonary nodule: Secondary | ICD-10-CM | POA: Diagnosis present

## 2015-09-13 DIAGNOSIS — J449 Chronic obstructive pulmonary disease, unspecified: Secondary | ICD-10-CM | POA: Insufficient documentation

## 2015-09-13 DIAGNOSIS — Z951 Presence of aortocoronary bypass graft: Secondary | ICD-10-CM | POA: Diagnosis not present

## 2015-09-13 DIAGNOSIS — G8929 Other chronic pain: Secondary | ICD-10-CM | POA: Diagnosis not present

## 2015-09-13 DIAGNOSIS — C771 Secondary and unspecified malignant neoplasm of intrathoracic lymph nodes: Secondary | ICD-10-CM | POA: Insufficient documentation

## 2015-09-13 DIAGNOSIS — I252 Old myocardial infarction: Secondary | ICD-10-CM | POA: Insufficient documentation

## 2015-09-13 DIAGNOSIS — R918 Other nonspecific abnormal finding of lung field: Secondary | ICD-10-CM | POA: Insufficient documentation

## 2015-09-13 DIAGNOSIS — E119 Type 2 diabetes mellitus without complications: Secondary | ICD-10-CM | POA: Diagnosis not present

## 2015-09-13 DIAGNOSIS — I10 Essential (primary) hypertension: Secondary | ICD-10-CM | POA: Diagnosis not present

## 2015-09-13 DIAGNOSIS — Z9889 Other specified postprocedural states: Secondary | ICD-10-CM | POA: Insufficient documentation

## 2015-09-13 DIAGNOSIS — Z8701 Personal history of pneumonia (recurrent): Secondary | ICD-10-CM | POA: Insufficient documentation

## 2015-09-13 DIAGNOSIS — R599 Enlarged lymph nodes, unspecified: Secondary | ICD-10-CM | POA: Insufficient documentation

## 2015-09-13 HISTORY — PX: ENDOBRONCHIAL ULTRASOUND: SHX5096

## 2015-09-13 LAB — GLUCOSE, CAPILLARY: GLUCOSE-CAPILLARY: 124 mg/dL — AB (ref 65–99)

## 2015-09-13 SURGERY — ENDOBRONCHIAL ULTRASOUND (EBUS)
Anesthesia: General

## 2015-09-13 MED ORDER — OXYCODONE HCL 5 MG/5ML PO SOLN: 5.0000 mg | Freq: Once | ORAL | Status: DC | PRN

## 2015-09-13 MED ORDER — IPRATROPIUM-ALBUTEROL 0.5-2.5 (3) MG/3ML IN SOLN
RESPIRATORY_TRACT | Status: AC
  Administered 2015-09-13: 3 mL via RESPIRATORY_TRACT
  Filled 2015-09-13: qty 3

## 2015-09-13 MED ORDER — FAMOTIDINE 20 MG PO TABS
20.0000 mg | ORAL_TABLET | Freq: Once | ORAL | Status: AC
  Administered 2015-09-13: 20 mg via ORAL

## 2015-09-13 MED ORDER — ONDANSETRON HCL 4 MG/2ML IJ SOLN
INTRAMUSCULAR | Status: DC | PRN
  Administered 2015-09-13: 4 mg via INTRAVENOUS

## 2015-09-13 MED ORDER — FENTANYL CITRATE (PF) 100 MCG/2ML IJ SOLN
INTRAMUSCULAR | Status: DC | PRN
  Administered 2015-09-13 (×2): 50 ug via INTRAVENOUS

## 2015-09-13 MED ORDER — PROPOFOL 10 MG/ML IV BOLUS
INTRAVENOUS | Status: DC | PRN
Start: 2015-09-13 — End: 2015-09-13
  Administered 2015-09-13: 100 mg via INTRAVENOUS

## 2015-09-13 MED ORDER — IPRATROPIUM-ALBUTEROL 0.5-2.5 (3) MG/3ML IN SOLN
3.0000 mL | Freq: Once | RESPIRATORY_TRACT | Status: AC
  Administered 2015-09-13: 3 mL via RESPIRATORY_TRACT

## 2015-09-13 MED ORDER — ROCURONIUM BROMIDE 100 MG/10ML IV SOLN
INTRAVENOUS | Status: DC | PRN
  Administered 2015-09-13: 30 mg via INTRAVENOUS
  Administered 2015-09-13: 20 mg via INTRAVENOUS

## 2015-09-13 MED ORDER — LIDOCAINE HCL (CARDIAC) 20 MG/ML IV SOLN
INTRAVENOUS | Status: DC | PRN
  Administered 2015-09-13: 100 mg via INTRAVENOUS

## 2015-09-13 MED ORDER — IPRATROPIUM-ALBUTEROL 0.5-2.5 (3) MG/3ML IN SOLN
3.0000 mL | Freq: Once | RESPIRATORY_TRACT | Status: DC | PRN
  Administered 2015-09-13: 3 mL via RESPIRATORY_TRACT

## 2015-09-13 MED ORDER — FENTANYL CITRATE (PF) 100 MCG/2ML IJ SOLN: 25.0000 ug | INTRAMUSCULAR | Status: DC | PRN

## 2015-09-13 MED ORDER — SUGAMMADEX SODIUM 200 MG/2ML IV SOLN
INTRAVENOUS | Status: DC | PRN
  Administered 2015-09-13: 160 mg via INTRAVENOUS

## 2015-09-13 MED ORDER — OXYCODONE HCL 5 MG PO TABS: 5.0000 mg | ORAL_TABLET | Freq: Once | ORAL | Status: DC | PRN

## 2015-09-13 MED ORDER — SODIUM CHLORIDE 0.9 % IV SOLN
INTRAVENOUS | Status: DC
  Administered 2015-09-13 (×2): via INTRAVENOUS

## 2015-09-13 NOTE — Anesthesia Procedure Notes (Signed)
Procedure Name: Intubation Date/Time: 09/13/2015 1:23 PM Performed by: Silvana Newness Pre-anesthesia Checklist: Patient identified, Emergency Drugs available, Suction available, Patient being monitored and Timeout performed Patient Re-evaluated:Patient Re-evaluated prior to inductionOxygen Delivery Method: Circle system utilized Preoxygenation: Pre-oxygenation with 100% oxygen Intubation Type: IV induction Ventilation: Oral airway inserted - appropriate to patient size Laryngoscope Size: Mac and 4 Grade View: Grade I Tube type: Oral Tube size: 8.5 mm Number of attempts: 1 Airway Equipment and Method: Stylet Placement Confirmation: ETT inserted through vocal cords under direct vision,  positive ETCO2 and breath sounds checked- equal and bilateral Secured at: 22 cm Tube secured with: Tape Dental Injury: Teeth and Oropharynx as per pre-operative assessment

## 2015-09-13 NOTE — Transfer of Care (Signed)
Immediate Anesthesia Transfer of Care Note  Patient: Jared Jefferson  Procedure(s) Performed: Procedure(s): ENDOBRONCHIAL ULTRASOUND (N/A)  Patient Location: PACU  Anesthesia Type:General  Level of Consciousness: awake, alert , oriented and patient cooperative  Airway & Oxygen Therapy: Patient Spontanous Breathing and Patient connected to face mask oxygen  Post-op Assessment: Report given to RN, Post -op Vital signs reviewed and stable and Patient moving all extremities X 4  Post vital signs: Reviewed and stable  Last Vitals:  Filed Vitals:   09/13/15 1220 09/13/15 1406  BP: 155/89 125/95  Pulse: 87 111  Temp: 36.7 C 36.2 C  Resp: 18 16    Complications: No apparent anesthesia complications

## 2015-09-13 NOTE — Anesthesia Preprocedure Evaluation (Signed)
Anesthesia Evaluation  Patient identified by MRN, date of birth, ID band Patient awake    Reviewed: Allergy & Precautions, H&P , NPO status , Patient's Chart, lab work & pertinent test results  History of Anesthesia Complications Negative for: history of anesthetic complications  Airway Mallampati: III  TM Distance: >3 FB Neck ROM: limited    Dental  (+) Poor Dentition, Chipped, Missing, Upper Dentures, Partial Lower   Pulmonary shortness of breath and with exertion, sleep apnea , pneumonia, COPD,  COPD inhaler, former smoker,    breath sounds clear to auscultation + decreased breath sounds      Cardiovascular Exercise Tolerance: Good hypertension, (-) angina+ CAD, + Past MI, + Cardiac Stents, + CABG, + Peripheral Vascular Disease, +CHF and + DOE  Normal cardiovascular exam+ dysrhythmias I Rhythm:regular Rate:Normal     Neuro/Psych  Neuromuscular disease negative psych ROS   GI/Hepatic Neg liver ROS, GERD  Controlled,  Endo/Other  diabetes, Type 2  Renal/GU negative Renal ROS  negative genitourinary   Musculoskeletal  (+) Arthritis ,   Abdominal   Peds  Hematology negative hematology ROS (+)   Anesthesia Other Findings Past Medical History:   COPD (chronic obstructive pulmonary disease) (*              Essential hypertension                                       Sciatica                                                       Comment:right side   Diabetes mellitus type 2 in obese (HCC)                      CAD (coronary artery disease)                                PAD (peripheral artery disease) (HCC)                        Shortness of breath dyspnea                                    Comment:dyspnea on exertion   Pulmonary nodule, right                                      Arthritis                                                    Hyperlipidemia                                               Myocardial  infarction ([place])  Dysrhythmia                                                  CHF (congestive heart failure) ([place])                         Pneumonia                                       January 2*     Comment:[facility] , [place], [place]   Anxiety                                                      GERD (gastroesophageal reflux disease)                       Sleep apnea                                                 Past Surgical History:   EXPLORATORY LAPAROTOMY                                        CORONARY ANGIOPLASTY                                            Comment:[facility]   CORONARY ARTERY BYPASS GRAFT                                    Comment:[facility]   COLON SURGERY                                                BMI    Body Mass Index   24.82 kg/m 2      Reproductive/Obstetrics negative OB ROS                             Anesthesia Physical Anesthesia Plan  ASA: IV  Anesthesia Plan: General ETT   Post-op Pain Management:    Induction:   Airway Management Planned:   Additional Equipment:   Intra-op Plan:   Post-operative Plan:   Informed Consent: I have reviewed the patients History and Physical, chart, labs and discussed the procedure including the risks, benefits and alternatives for the proposed anesthesia with the patient or authorized representative who has indicated his/her understanding and acceptance.   Dental Advisory Given  Plan Discussed with: Anesthesiologist, CRNA and Surgeon  Anesthesia Plan Comments: (Patient informed that they are  higher risk for complications from anesthesia during this procedure due to their medical history including but not limited to prolonged intubation.  Patient voiced understanding. )        Anesthesia Quick Evaluation

## 2015-09-13 NOTE — Op Note (Signed)
PROCEDURE: ENDOBRONCHIAL ULTRASOUND   PROCEDURE DATE: 09/13/2015  TIME:  NAME:  Jared Jefferson  DOB:1945/08/25  MRN: [id number] LOC:  ARPO/None    HOSP DAY: '@LENGTHOFSTAYDAYS'$ @ CODE STATUS:   Code Status History    Date Active Date Inactive Code Status Order ID Comments User Context   07/18/2015  1:40 AM 07/20/2015  6:40 PM Full Code [id number]  Harrie Foreman, MD Inpatient          Indications/Preliminary Diagnosis: adenopathy  Consent: (Place X beside choice/s below)  The benefits, risks and possible complications of the procedure were        explained to:  _x__ patient  _x__ patient's family  ___ other:___________  who verbalized understanding and gave:  ___ verbal  ___ written  _x__ verbal and written  ___ telephone  ___ other:________ consent.      Unable to obtain consent; procedure performed on emergent basis.     Other:       PRESEDATION ASSESSMENT: History and Physical has been performed. Patient meds and allergies have been reviewed. Presedation airway examination has been performed and documented. Baseline vital signs, sedation score, oxygenation status, and cardiac rhythm were reviewed. Patient was deemed to be in satisfactory condition to undergo the procedure.    PREMEDICATIONS: SEE ANESTHESIOLOGY RECORDS   Airway Prep (Place X beside choice below)   1% Transtracheal Lidocaine Anesthetization 7 cc   Patient prepped per Bronchoscopy Lab Policy       Insertion Route (Place X beside choice below)   Nasal   Oral  x Endotracheal Tube   Tracheostomy   INTRAPROCEDURE MEDICATIONS: SEE ANESTHESIOLOGY RECORDS   PROCEDURE DETAILS: Timeout performed and correct patient, name, & ID confirmed. Following prep per Pulmonary policy, appropriate sedation was administered.  Airway exam proceeded with findings, technical procedures, and specimen collection as noted below. At the end of exam the scope was withdrawn without incident. Impression and Plan as noted  below.   There was enlarged left Hilar LN  approx 2.5x 3 cm. I inserted EBUS scope into left lung and visualized the LN. Transbronchial FNA samples were taken     TECHNICAL PROCEDURES: (Place X beside choice below)   Procedures  Description    None     Electrocautery     Cryotherapy     Balloon Dilatation     Bronchography     Stent Placement     Therapeutic Aspiration     Laser/Argon Plasma    Brachytherapy Catheter Placement    Foreign Body Removal     SPECIMENS (Sites): (Place X beside choice below)  Specimens Description   No Specimens Obtained     [place]    Lavage    Biopsies   x Fine Needle Aspirates 5 samples   Brushings    Sputum    FINDINGS: Left Hilar Enlarged ESTIMATED BLOOD LOSS: none COMPLICATIONS/RESOLUTION: none       IMPRESSION:POST-PROCEDURE DX: 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  [facility]  Medical Director [place] Director [place] Department

## 2015-09-13 NOTE — H&P (Signed)
Mertie Moores, MD - 08/17/2015 8:45 AM EDT Formatting of this note may be different from the original.   Follow-up  History of Present Illness: Jared Jefferson is a 70 y.o. male presents to clinic for recheck. Went over pet scan. Report shown below. Rul nodule is pet positive. His mediastinum is also pet positive. He is coughing, no hemoptysis, appetite is down. Fev1 1.26 liters. Not on oxygen. Discussed the work up. Bronch/ebus recommended. Positive asbestos exposure. He was a Psychologist, occupational. See my last hpi.   Current Medications:  Current Outpatient Prescriptions  Medication Sig Dispense Refill  . albuterol (VENTOLIN HFA;PROAIR HFA) 90 mcg/actuation inhaler Inhale 2 inhalations into the lungs every 6 (six) hours as needed. 1 Inhaler 11  . albuterol 2.5 mg /3 mL (0.083 %) Nebu 3 mL, albuterol 5 mg/mL Nebu 0.6 mL aerosol nebs Inhale 2.5 mg into the lungs every 6 (six) hours. 30 mL 11  . ALPRAZolam (XANAX) 1 MG tablet Take 1 mg by mouth 3 (three) times daily as needed. Sleep or anxiety  . aspirin 81 MG EC tablet Take 1 tablet (81 mg total) by mouth daily. 30 tablet 11  . aspirin-calcium carbonate 81 mg-300 mg calcium(777 mg) Tab Take by mouth.  Marland Kitchen atorvastatin (LIPITOR) 40 MG tablet Take 1 tablet (40 mg total) by mouth daily. 30 tablet 11  . budesonide-formoterol (SYMBICORT) 160-4.5 mcg/actuation inhaler Inhale 2 inhalations into the lungs 2 (two) times daily. 1 Inhaler 12  . carisoprodol (SOMA) 350 MG tablet Take 350 mg by mouth every 8 (eight) hours.  . carvedilol (COREG CR) 20 MG CR capsule Take by mouth.  . ciprofloxacin-dexamethasone (CIPRODEX) otic suspension Inject into the eye.  . clopidogrel (PLAVIX) 75 mg tablet Take 1 tablet (75 mg total) by mouth daily. 30 tablet 11  . desloratadine (CLARINEX) 5 mg tablet Take by mouth.  . diphenhydrAMINE (BENADRYL) 25 mg capsule Take 25 mg by mouth as directed for Itching. 75 mg in A.M. and 100 mg in P.M.  . famotidine (PEPCID) 20 MG tablet Take by  mouth.  Marland Kitchen HYDROcodone-acetaminophen (NORCO) 10-325 mg tablet Take 1 tablet by mouth every 4 (four) hours as needed. 30 tablet 0  . isosorbide dinitrate (ISORDIL) 30 MG tablet Take 30 mg by mouth.  . isosorbide mononitrate (IMDUR) 30 MG ER tablet Take 30 mg by mouth.  Marland Kitchen lisinopril (PRINIVIL,ZESTRIL) 10 MG tablet Take 1 tablet (10 mg total) by mouth once daily. 30 tablet 11  . meloxicam (MOBIC) 15 MG tablet Take 15 mg by mouth.  . metoprolol succinate (TOPROL-XL) 100 MG XL tablet Take 1 tablet (100 mg total) by mouth once daily. 30 tablet 0  . omeprazole (PRILOSEC) 40 MG DR capsule Take 40 mg by mouth.  . pioglitazone-metFORMIN (ACTOPLUS MET) 15-850 mg tablet Take 1 tablet by mouth 2 (two) times daily with meals. 60 tablet 11  . RSH STUDY Albuterol -eIRB [id number] inhalation solution Inhale into the lungs.  . tiotropium (SPIRIVA) 18 mcg inhalation capsule Place 1 capsule (18 mcg total) into inhaler and inhale daily. 30 capsule 11   No current facility-administered medications for this visit.   Problem List:  Patient Active Problem List  Diagnosis  . Essential hypertension  . Diabetes mellitus type 2, uncomplicated (CMS-HCC)  . COPD (chronic obstructive pulmonary disease) , unspecified (CMS-HCC)  . Arthritis  . Chronic LBP  . CAD (coronary artery disease)  . Tobacco abuse  . Lipids abnormal  . Coronary artery disease involving coronary bypass graft of native  heart without angina pectoris   Allergies:  Shellfish containing products and Iodine and iodide containing products  History:  Past Medical History  Diagnosis Date  . Arthritis  . COPD (chronic obstructive pulmonary disease) , unspecified (CMS-HCC)  . Diabetes mellitus type 2, uncomplicated (CMS-HCC)  . Hypertension  . Myocardial infarction (CMS-HCC)   Past Surgical History  Procedure Laterality Date  . Coronary artery bypass graft  . Jaw reconstruction  . Colectomy partial abdominal & transanal  . Hemorrhoidectomy by simple  ligation  . Coronary angioplasty  . Colon surgery   Family History  Problem Relation Age of Onset  . Coronary artery disease Father   reports that he has quit smoking. His smoking use included Cigarettes. He has a 55.00 pack-year smoking history. He has never used smokeless tobacco. He reports that he does not drink alcohol or use illicit drugs.  Review of System: As per above. All systems were reviewed with him in totality. Again, no other cardiopulmonary symptoms. No nausea, vomiting, diarrhea, blood in stools,dysuria or flank pain. No GERD, dysphagia, choking spells, polyphagia, polydipsia, heat or cold intolerance, rashes, lymph nodes, bleeding, seizures, TIAs, passing out spells, suicidaly ideation, proximal muscle tenderness, joint effusions. No strong history to suggest sleep apnea. .   Physical Exam: Visit Vitals  . BP 121/82  . Pulse 76  . Temp 36.4 C (97.6 F) (Oral)  . Wt 78 kg (172 lb)  . SpO2 95%  . BMI 26.94 kg/m2  78 kg (172 lb) 95% General: NAD. Able to speak in complete sentences without cough or dyspnea HEENT: Normocephalic, nontraumatic. Extraocular movements intact NECK: Supple. No JVD, nodes, thryomegaly CV: RRR no murmurs, gallops, rubs PULM: Normal respiratory effort, Clear to auscultation bilaterally without wheezing or crackles EXTREMITIESs: No significant edema, cyanosis or Homans'signs SKIN: Fair turgor. No rashes LYMPHATIC: No nodes NEURO: No gross deficits PSYCH: Appropriate affect, alert, oriented   Diagnostics: CLINICAL DATA:70 year old male with shortness of breath. History of recent pneumonia. Patient reports intubated chest pain.  EXAM: CT CHEST WITHOUT CONTRAST  TECHNIQUE: Multidetector CT imaging of the chest was performed following the standard protocol without IV contrast.  COMPARISON:Chest radiograph dated 07/17/2015  FINDINGS: There is emphysematous changes of the lungs. There is diffuse interstitial prominence involving  the entire right lung concerning for pneumonia. There is a 9 mm nodular density in the right upper lobe posteriorly (series 2, image 16). The left lung is clear. There is no pleural effusion or pneumothorax. The central airways are patent. There is narrowing of the transverse diameter of the trachea (saber sheath trachea) compatible with chronic obstructive lung disease. There is no hilar or mediastinal adenopathy. There is atherosclerotic calcification of the thoracic aorta. There is no cardiomegaly or pericardial effusion. Advanced coronary vascular calcification as well as CABG changes. Esophagus is grossly unremarkable.  There is no axillary adenopathy. The chest wall soft tissues appear unremarkable. Median sternotomy wires. There is degenerative changes of the spine. No acute fracture.  There is layering small stones within the gallbladder. No pericholecystic fluid. The visualized upper abdomen is otherwise unremarkable.  IMPRESSION: Emphysema with diffuse interstitial thickening of the right lung concerning for pneumonia.  A 9 mm right upper lobe pulmonary nodule. As per Fleischner Society criteria Follow-up with CT at 3, 9, and 24 months or dynamic contrast-enhanced CT, PET, and/or biopsy recommended.  Electronically Signed By: Gary Fleet M.D. On: 07/17/2015 22:15  IMPRESSION: Pet SCAN 1. The right upper lobe pulmonary nodule is hypermetabolic, worrisome for malignancy. 2. In  addition, there is hypermetabolic mediastinal and hilar lymphadenopathy bilaterally. This is most prominent within the left hilum. Some of these lymph nodes are partially calcified, suggesting granulomatous infection. However, metastatic disease is a definite concern. 3. The asymmetric thickening of the bronchovascular bundles on the right is also mildly hypermetabolic. This is an atypical appearance for acute infection, although could be related to chronic granulomatous disease like  sarcoidosis. Lymphangitic spread of tumor could have this appearance and is the primary concern. 4. No evidence of distant hematogenous metastases. 5. Bronchoscopy recommended.   Impression: Stage III copd, bronchitis Right upper lobe nodule, 9 mm, pet positive with mediastinal involvement ? Malignancy with lymphogenic spread vs sarcoidosis or some other granulomatous.  Ex smoker  Plan: Continue albuterol, spiriva, symbicort 160/4.5 two puffs q 12 hours Stay off cigarettes Bronch/ebus to be scheduled Will hold plavix 5 days prior to procedure Check ace, hypersensitivity pneumonitis panel Follow up in 1 week post biopsy

## 2015-09-13 NOTE — Discharge Instructions (Signed)
Flexible Bronchoscopy, Care After Refer to this sheet in the next few weeks. These instructions provide you with information on caring for yourself after your procedure. Your health care provider may also give you more specific instructions. Your treatment has been planned according to current medical practices, but problems sometimes occur. Call your health care provider if you have any problems or questions after your procedure.  WHAT TO EXPECT AFTER THE PROCEDURE It is normal to have the following symptoms for 24-48 hours after the procedure:   Increased cough.  Low-grade fever.  Sore throat or hoarse voice.  Small streaks of blood in your thick spit (sputum) if tissue samples were taken (biopsy). HOME CARE INSTRUCTIONS   Do not eat or drink anything for 2 hours after your procedure. Your nose and throat were numbed by medicine. If you try to eat or drink before the medicine wears off, food or drink could go into your lungs or you could burn yourself. After the numbness is gone and your cough and gag reflexes have returned, you may eat soft food and drink liquids slowly.   The day after the procedure, you can go back to your normal diet.   You may resume normal activities.   Keep all follow-up visits as directed by your health care provider. It is important to keep all your appointments, especially if tissue samples were taken for testing (biopsy). SEEK IMMEDIATE MEDICAL CARE IF:   You have increasing shortness of breath.   You become light-headed or faint.   You have chest pain.   You have any new concerning symptoms.  You cough up more than a small amount of blood.  The amount of blood you cough up increases. MAKE SURE YOU:  Understand these instructions.  Will watch your condition.  Will get help right away if you are not doing well or get worse.   This information is not intended to replace advice given to you by your health care provider. Make sure you discuss  any questions you have with your health care provider.   Document Released: 11/24/2004 Document Revised: 05/28/2014 Document Reviewed: 01/09/2013 Elsevier Interactive Patient Education [employer].

## 2015-09-14 ENCOUNTER — Encounter: Payer: Self-pay | Admitting: Internal Medicine

## 2015-09-14 LAB — CYTOLOGY - NON PAP

## 2015-09-14 NOTE — Anesthesia Postprocedure Evaluation (Signed)
Anesthesia Post Note  Patient: Jared Jefferson  Procedure(s) Performed: Procedure(s) (LRB): ENDOBRONCHIAL ULTRASOUND (N/A)  Patient location during evaluation: PACU Anesthesia Type: General Level of consciousness: awake and alert Pain management: pain level controlled Vital Signs Assessment: post-procedure vital signs reviewed and stable Respiratory status: spontaneous breathing, nonlabored ventilation, respiratory function stable and patient connected to nasal cannula oxygen Cardiovascular status: blood pressure returned to baseline and stable Postop Assessment: no signs of nausea or vomiting Anesthetic complications: no    Last Vitals:  Filed Vitals:   09/13/15 1543 09/13/15 1600  BP: 119/65 139/71  Pulse: 92 82  Temp: 36 C   Resp: 18     Last Pain:  Filed Vitals:   09/14/15 0854  PainSc: 4                  Precious Haws Piscitello

## 2015-09-21 ENCOUNTER — Telehealth: Payer: Self-pay | Admitting: *Deleted

## 2015-09-21 NOTE — Telephone Encounter (Signed)
Contacted pulmonologist to inquire about oncology referral. Appointment obtained for medical oncology Tuesday 09/27/15 at 2:30 pm in [place]. Patient contacted and given appt as well as introducing navigation program. Patient has my contact # if he has any questions.

## 2015-09-22 LAB — GLUCOSE, CAPILLARY: GLUCOSE-CAPILLARY: 121 mg/dL — AB (ref 65–99)

## 2015-09-27 ENCOUNTER — Inpatient Hospital Stay: Payer: Medicare Other

## 2015-09-27 ENCOUNTER — Inpatient Hospital Stay: Payer: Medicare Other | Attending: Internal Medicine | Admitting: Internal Medicine

## 2015-09-27 ENCOUNTER — Encounter: Payer: Self-pay | Admitting: *Deleted

## 2015-09-27 ENCOUNTER — Other Ambulatory Visit: Payer: Self-pay | Admitting: Internal Medicine

## 2015-09-27 VITALS — BP 113/75 | HR 62 | Temp 96.6°F | Resp 18 | Wt 162.3 lb

## 2015-09-27 DIAGNOSIS — R634 Abnormal weight loss: Secondary | ICD-10-CM | POA: Insufficient documentation

## 2015-09-27 DIAGNOSIS — R481 Agnosia: Secondary | ICD-10-CM

## 2015-09-27 DIAGNOSIS — M1990 Unspecified osteoarthritis, unspecified site: Secondary | ICD-10-CM | POA: Diagnosis not present

## 2015-09-27 DIAGNOSIS — Z951 Presence of aortocoronary bypass graft: Secondary | ICD-10-CM | POA: Diagnosis not present

## 2015-09-27 DIAGNOSIS — I252 Old myocardial infarction: Secondary | ICD-10-CM | POA: Diagnosis not present

## 2015-09-27 DIAGNOSIS — Z79899 Other long term (current) drug therapy: Secondary | ICD-10-CM | POA: Diagnosis not present

## 2015-09-27 DIAGNOSIS — Z7984 Long term (current) use of oral hypoglycemic drugs: Secondary | ICD-10-CM | POA: Diagnosis not present

## 2015-09-27 DIAGNOSIS — F419 Anxiety disorder, unspecified: Secondary | ICD-10-CM | POA: Insufficient documentation

## 2015-09-27 DIAGNOSIS — E785 Hyperlipidemia, unspecified: Secondary | ICD-10-CM | POA: Diagnosis not present

## 2015-09-27 DIAGNOSIS — K219 Gastro-esophageal reflux disease without esophagitis: Secondary | ICD-10-CM | POA: Diagnosis not present

## 2015-09-27 DIAGNOSIS — Z7982 Long term (current) use of aspirin: Secondary | ICD-10-CM | POA: Diagnosis not present

## 2015-09-27 DIAGNOSIS — G4730 Sleep apnea, unspecified: Secondary | ICD-10-CM | POA: Diagnosis not present

## 2015-09-27 DIAGNOSIS — C3410 Malignant neoplasm of upper lobe, unspecified bronchus or lung: Secondary | ICD-10-CM | POA: Insufficient documentation

## 2015-09-27 DIAGNOSIS — I2510 Atherosclerotic heart disease of native coronary artery without angina pectoris: Secondary | ICD-10-CM | POA: Insufficient documentation

## 2015-09-27 DIAGNOSIS — R0789 Other chest pain: Secondary | ICD-10-CM | POA: Diagnosis not present

## 2015-09-27 DIAGNOSIS — I10 Essential (primary) hypertension: Secondary | ICD-10-CM | POA: Insufficient documentation

## 2015-09-27 DIAGNOSIS — C3411 Malignant neoplasm of upper lobe, right bronchus or lung: Secondary | ICD-10-CM | POA: Diagnosis present

## 2015-09-27 DIAGNOSIS — C771 Secondary and unspecified malignant neoplasm of intrathoracic lymph nodes: Secondary | ICD-10-CM | POA: Diagnosis not present

## 2015-09-27 DIAGNOSIS — J449 Chronic obstructive pulmonary disease, unspecified: Secondary | ICD-10-CM | POA: Insufficient documentation

## 2015-09-27 DIAGNOSIS — E119 Type 2 diabetes mellitus without complications: Secondary | ICD-10-CM | POA: Insufficient documentation

## 2015-09-27 DIAGNOSIS — F17210 Nicotine dependence, cigarettes, uncomplicated: Secondary | ICD-10-CM | POA: Diagnosis not present

## 2015-09-27 DIAGNOSIS — R630 Anorexia: Secondary | ICD-10-CM

## 2015-09-27 DIAGNOSIS — I509 Heart failure, unspecified: Secondary | ICD-10-CM | POA: Insufficient documentation

## 2015-09-27 DIAGNOSIS — C3491 Malignant neoplasm of unspecified part of right bronchus or lung: Secondary | ICD-10-CM | POA: Insufficient documentation

## 2015-09-27 LAB — CBC WITH DIFFERENTIAL/PLATELET
BASOS ABS: 0.1 10*3/uL (ref 0–0.1)
EOS ABS: 0.4 10*3/uL (ref 0–0.7)
Eosinophils Relative: 5 %
HEMATOCRIT: 45.6 % (ref 40.0–52.0)
HEMOGLOBIN: 14.9 g/dL (ref 13.0–18.0)
Lymphocytes Relative: 18 %
Lymphs Abs: 1.5 10*3/uL (ref 1.0–3.6)
MCH: 29.1 pg (ref 26.0–34.0)
MCHC: 32.8 g/dL (ref 32.0–36.0)
MCV: 88.9 fL (ref 80.0–100.0)
Monocytes Absolute: 0.7 10*3/uL (ref 0.2–1.0)
Monocytes Relative: 8 %
NEUTROS ABS: 5.7 10*3/uL (ref 1.4–6.5)
Platelets: 365 10*3/uL (ref 150–440)
RBC: 5.13 MIL/uL (ref 4.40–5.90)
RDW: 15.6 % — ABNORMAL HIGH (ref 11.5–14.5)
WBC: 8.3 10*3/uL (ref 3.8–10.6)

## 2015-09-27 LAB — URINALYSIS COMPLETE WITH MICROSCOPIC (ARMC ONLY)
Bilirubin Urine: NEGATIVE
GLUCOSE, UA: NEGATIVE mg/dL
KETONES UR: NEGATIVE mg/dL
Nitrite: NEGATIVE
PROTEIN: NEGATIVE mg/dL
SPECIFIC GRAVITY, URINE: 1.02 (ref 1.005–1.030)
SQUAMOUS EPITHELIAL / LPF: NONE SEEN
pH: 5.5 (ref 5.0–8.0)

## 2015-09-27 LAB — COMPREHENSIVE METABOLIC PANEL
ALK PHOS: 70 U/L (ref 38–126)
ALT: 11 U/L — AB (ref 17–63)
ANION GAP: 9 (ref 5–15)
AST: 18 U/L (ref 15–41)
Albumin: 3.7 g/dL (ref 3.5–5.0)
BILIRUBIN TOTAL: 0.3 mg/dL (ref 0.3–1.2)
BUN: 20 mg/dL (ref 6–20)
CALCIUM: 9.1 mg/dL (ref 8.9–10.3)
CO2: 25 mmol/L (ref 22–32)
CREATININE: 1 mg/dL (ref 0.61–1.24)
Chloride: 101 mmol/L (ref 101–111)
Glucose, Bld: 110 mg/dL — ABNORMAL HIGH (ref 65–99)
Potassium: 4.3 mmol/L (ref 3.5–5.1)
SODIUM: 135 mmol/L (ref 135–145)
TOTAL PROTEIN: 7.1 g/dL (ref 6.5–8.1)

## 2015-09-27 LAB — LACTATE DEHYDROGENASE: LDH: 238 U/L — AB (ref 98–192)

## 2015-09-27 MED ORDER — PROCHLORPERAZINE MALEATE 10 MG PO TABS: 10.0000 mg | ORAL_TABLET | Freq: Four times a day (QID) | ORAL | Status: DC | PRN

## 2015-09-27 MED ORDER — LIDOCAINE-PRILOCAINE 2.5-2.5 % EX CREA: 1.0000 "application " | TOPICAL_CREAM | CUTANEOUS | Status: DC | PRN

## 2015-09-27 MED ORDER — FOLIC ACID 1 MG PO TABS: 1.0000 mg | ORAL_TABLET | Freq: Every day | ORAL | Status: DC

## 2015-09-27 MED ORDER — ONDANSETRON HCL 8 MG PO TABS: 8.0000 mg | ORAL_TABLET | Freq: Three times a day (TID) | ORAL | Status: AC | PRN

## 2015-09-27 MED ORDER — DEXAMETHASONE 4 MG PO TABS: ORAL_TABLET | ORAL | Status: DC

## 2015-09-27 NOTE — Progress Notes (Signed)
Patient here today as new evaluation regarding lung cancer.  Referred by Dr. Raul Del.  Patient states he is in constant pain.  His ribcage is bothering him as well as his back (old injury). Wants to know if he can get a prescription for pain medication that will allow him to function.

## 2015-09-27 NOTE — Patient Instructions (Addendum)
Lung Cancer Lung cancer occurs when abnormal cells in the lung grow out of control and form a mass (tumor). There are several types of lung cancer. The two most common types are:  Non-small cell. In this type of lung cancer, abnormal cells are larger and grow more slowly than those of small cell lung cancer.  Small cell. In this type of lung cancer, abnormal cells are smaller than those of non-small cell lung cancer. Small cell lung cancer gets worse faster than non-small cell lung cancer. CAUSES  The leading cause of lung cancer is smoking tobacco. The second leading cause is radon exposure. RISK FACTORS  Smoking tobacco.  Exposure to secondhand tobacco smoke.  Exposure to radon gas.  Exposure to asbestos.  Exposure to arsenic in drinking water.  Air pollution.  Family or personal history of lung cancer.  Lung radiation therapy.  Being older than 71 years. SIGNS AND SYMPTOMS  In the early stages, symptoms may not be present. As the cancer progresses, symptoms may include:  A lasting cough, possibly with blood.  Fatigue.  Unexplained weight loss.  Shortness of breath.  Wheezing.  Chest pain.  Loss of appetite. Symptoms of advanced lung cancer include:  Hoarseness.  Bone or joint pain.  Weakness.  Nail problems.  Face or arm swelling.  Paralysis of the face.  Drooping eyelids. DIAGNOSIS  Lung cancer can be identified with a physical exam and with tests such as:  A chest X-ray.  A CT scan.  Blood tests.  A biopsy. After a diagnosis is made, you will have more tests to determine the stage of the cancer. The stages of non-small cell lung cancer are:  Stage 0, also called carcinoma in situ. At this stage, abnormal cells are found in the inner lining of your lung or lungs.  Stage I. At this stage, abnormal cells have grown into a tumor that is no larger than 5 cm across. The cancer has entered the deeper lung tissue but has not yet entered the lymph  nodes or other parts of the body.  Stage II. At this stage, the tumor is 7 cm across or smaller and has entered nearby lymph nodes. Or, the tumor is 5 cm across or smaller and has invaded surrounding tissue but is not found in nearby lymph nodes. There may be more than one tumor present.  Stage III. At this stage, the tumor may be any size. There may be more than one tumor in the lungs. The cancer cells have spread to the lymph nodes and possibly to other organs.  Stage IV. At this stage, there are tumors in both lungs and the cancer has spread to other areas of the body. The stages of small cell lung cancer are:  Limited. At this stage, the cancer is found only on one side of the chest.  Extensive. At this stage, the cancer is in the lungs and in tissues on the other side of the chest. The cancer has spread to other organs or is found in the fluid between the layers of your lungs. TREATMENT  Depending on the type and stage of your lung cancer, you may be treated with:  Surgery. This is done to remove a tumor.  Radiation therapy. This treatment destroys cancer cells using X-rays or other types of radiation.  Chemotherapy. This treatment uses medicines to destroy cancer cells.  Targeted therapy. This treatment aims to destroy only cancer cells instead of all cells as other therapies do. You may  also have a combination of treatments. HOME CARE INSTRUCTIONS   Do not use any tobacco products. This includes cigarettes, chewing tobacco, and electronic cigarettes. If you need help quitting, ask your health care provider.  Take medicines only as directed by your health care provider.  Eat a healthy diet. Work with a dietitian to make sure you are getting the nutrition you need.  Consider joining a support group or seeking counseling to help you cope with the stress of having lung cancer.  Let your cancer specialist (oncologist) know if you are admitted to the hospital.  Keep all follow-up  visits as directed by your health care provider. This is important. SEEK MEDICAL CARE IF:   You lose weight without trying.  You have a persistent cough and wheezing.  You feel short of breath.  You tire easily.  You experience bone or joint pain.  You have difficulty swallowing.  You feel hoarse or notice your voice changing.  Your pain medicine is not helping. SEEK IMMEDIATE MEDICAL CARE IF:   You cough up blood.  You have new breathing problems.  You develop chest pain.  You develop swelling in:  One or both ankles or legs.  Your face, neck, or arms.  You are confused.  You experience paralysis in your face or a drooping eyelid.   This information is not intended to replace advice given to you by your health care provider. Make sure you discuss any questions you have with your health care provider.   Document Released: 08/13/2000 Document Revised: 01/26/2015 Document Reviewed: 09/10/2013 Elsevier Interactive Patient Education 2016 [place] An implanted port is a type of central line that is placed under the skin. Central lines are used to provide IV access when treatment or nutrition needs to be given through a person's veins. Implanted ports are used for long-term IV access. An implanted port may be placed because:   You need IV medicine that would be irritating to the small veins in your hands or arms.   You need long-term IV medicines, such as antibiotics.   You need IV nutrition for a long period.   You need frequent blood draws for lab tests.   You need dialysis.  Implanted ports are usually placed in the chest area, but they can also be placed in the upper arm, the abdomen, or the leg. An implanted port has two main parts:   Reservoir. The reservoir is round and will appear as a small, raised area under your skin. The reservoir is the part where a needle is inserted to give medicines or draw blood.    Catheter. The catheter is a thin, flexible tube that extends from the reservoir. The catheter is placed into a large vein. Medicine that is inserted into the reservoir goes into the catheter and then into the vein.  HOW WILL I CARE FOR MY INCISION SITE? Do not get the incision site wet. Bathe or shower as directed by your health care provider.  HOW IS MY PORT ACCESSED? Special steps must be taken to access the port:   Before the port is accessed, a numbing cream can be placed on the skin. This helps numb the skin over the port site.   Your health care provider uses a sterile technique to access the port.  Your health care provider must put on a mask and sterile gloves.  The skin over your port is cleaned carefully with an antiseptic  and allowed to dry.  The port is gently pinched between sterile gloves, and a needle is inserted into the port.  Only "non-coring" port needles should be used to access the port. Once the port is accessed, a blood return should be checked. This helps ensure that the port is in the vein and is not clogged.   If your port needs to remain accessed for a constant infusion, a clear (transparent) bandage will be placed over the needle site. The bandage and needle will need to be changed every week, or as directed by your health care provider.   Keep the bandage covering the needle clean and dry. Do not get it wet. Follow your health care provider's instructions on how to take a shower or bath while the port is accessed.   If your port does not need to stay accessed, no bandage is needed over the port.  WHAT IS FLUSHING? Flushing helps keep the port from getting clogged. Follow your health care provider's instructions on how and when to flush the port. Ports are usually flushed with saline solution or a medicine called heparin. The need for flushing will depend on how the port is used.   If the port is used for intermittent medicines or blood draws, the port  will need to be flushed:   After medicines have been given.   After blood has been drawn.   As part of routine maintenance.   If a constant infusion is running, the port may not need to be flushed.  HOW LONG WILL MY PORT STAY IMPLANTED? The port can stay in for as long as your health care provider thinks it is needed. When it is time for the port to come out, surgery will be done to remove it. The procedure is similar to the one performed when the port was put in.  WHEN SHOULD I SEEK IMMEDIATE MEDICAL CARE? When you have an implanted port, you should seek immediate medical care if:   You notice a bad smell coming from the incision site.   You have swelling, redness, or drainage at the incision site.   You have more swelling or pain at the port site or the surrounding area.   You have a fever that is not controlled with medicine.   This information is not intended to replace advice given to you by your health care provider. Make sure you discuss any questions you have with your health care provider.   Document Released: 05/07/2005 Document Revised: 02/25/2013 Document Reviewed: 01/12/2013 Elsevier Interactive Patient Education 2016 [place] over the counter claritin over the counter several days after the Neulasta injection!     Pegfilgrastim injection (Neulasta) What is this medicine? PEGFILGRASTIM (PEG fil gra stim) is a long-acting granulocyte colony-stimulating factor that stimulates the growth of neutrophils, a type of white blood cell important in the body's fight against infection. It is used to reduce the incidence of fever and infection in patients with certain types of cancer who are receiving chemotherapy that affects the bone marrow, and to increase survival after being exposed to high doses of radiation. This medicine may be used for other purposes; ask your health care provider or pharmacist if you have questions. What should I tell  my health care provider before I take this medicine? They need to know if you have any of these conditions: -kidney disease -latex allergy -ongoing radiation therapy -sickle cell disease -skin reactions to acrylic  adhesives (On-Body Injector only) -an unusual or allergic reaction to pegfilgrastim, filgrastim, other medicines, foods, dyes, or preservatives -pregnant or trying to get pregnant -breast-feeding How should I use this medicine? This medicine is for injection under the skin. If you get this medicine at home, you will be taught how to prepare and give the pre-filled syringe or how to use the On-body Injector. Refer to the patient Instructions for Use for detailed instructions. Use exactly as directed. Take your medicine at regular intervals. Do not take your medicine more often than directed. It is important that you put your used needles and syringes in a special sharps container. Do not put them in a trash can. If you do not have a sharps container, call your pharmacist or healthcare provider to get one. Talk to your pediatrician regarding the use of this medicine in children. While this drug may be prescribed for selected conditions, precautions do apply. Overdosage: If you think you have taken too much of this medicine contact a poison control center or emergency room at once. NOTE: This medicine is only for you. Do not share this medicine with others. What if I miss a dose? It is important not to miss your dose. Call your doctor or health care professional if you miss your dose. If you miss a dose due to an On-body Injector failure or leakage, a new dose should be administered as soon as possible using a single prefilled syringe for manual use. What may interact with this medicine? Interactions have not been studied. Give your health care provider a list of all the medicines, herbs, non-prescription drugs, or dietary supplements you use. Also tell them if you smoke, drink alcohol, or  use illegal drugs. Some items may interact with your medicine. This list may not describe all possible interactions. Give your health care provider a list of all the medicines, herbs, non-prescription drugs, or dietary supplements you use. Also tell them if you smoke, drink alcohol, or use illegal drugs. Some items may interact with your medicine. What should I watch for while using this medicine? You may need blood work done while you are taking this medicine. If you are going to need a MRI, CT scan, or other procedure, tell your doctor that you are using this medicine (On-Body Injector only). What side effects may I notice from receiving this medicine? Side effects that you should report to your doctor or health care professional as soon as possible: -allergic reactions like skin rash, itching or hives, swelling of the face, lips, or tongue -dizziness -fever -pain, redness, or irritation at site where injected -pinpoint red spots on the skin -red or dark-brown urine -shortness of breath or breathing problems -stomach or side pain, or pain at the shoulder -swelling -tiredness -trouble passing urine or change in the amount of urine Side effects that usually do not require medical attention (report to your doctor or health care professional if they continue or are bothersome): -bone pain -muscle pain This list may not describe all possible side effects. Call your doctor for medical advice about side effects. You may report side effects to FDA at 1-800-FDA-1088. Where should I keep my medicine? Keep out of the reach of children. Store pre-filled syringes in a refrigerator between 2 and 8 degrees C (36 and 46 degrees F). Do not freeze. Keep in carton to protect from light. Throw away this medicine if it is left out of the refrigerator for more than 48 hours. Throw away any unused medicine after  the expiration date. NOTE: This sheet is a summary. It may not cover all possible information. If you  have questions about this medicine, talk to your doctor, pharmacist, or health care provider.    2016, Elsevier/Gold Standard. (2014-05-27 14:30:14)       You will need to take a b12 injection and folic acid 1 mg while on the [place]    Pemetrexed injection What is this medicine? PEMETREXED (PEM e TREX ed) is a chemotherapy drug. This medicine affects cells that are rapidly growing, such as cancer cells and cells in your mouth and stomach. It is usually used to treat lung cancers like non-small cell lung cancer and mesothelioma. It may also be used to treat other cancers. This medicine may be used for other purposes; ask your health care provider or pharmacist if you have questions. What should I tell my health care provider before I take this medicine? They need to know if you have any of these conditions: -if you frequently drink alcohol containing beverages -infection (especially a virus infection such as chickenpox, cold sores, or herpes) -kidney disease -liver disease -low blood counts, like low platelets, red bloods, or white blood cells -an unusual or allergic reaction to pemetrexed, mannitol, other medicines, foods, dyes, or preservatives -pregnant or trying to get pregnant -breast-feeding How should I use this medicine? This drug is given as an infusion into a vein. It is administered in a hospital or clinic by a specially trained health care professional. Talk to your pediatrician regarding the use of this medicine in children. Special care may be needed. Overdosage: If you think you have taken too much of this medicine contact a poison control center or emergency room at once. NOTE: This medicine is only for you. Do not share this medicine with others. What if I miss a dose? It is important not to miss your dose. Call your doctor or health care professional if you are unable to keep an appointment. What may interact with this medicine? -aspirin and aspirin-like  medicines -medicines to increase blood counts like filgrastim, pegfilgrastim, sargramostim -methotrexate -NSAIDS, medicines for pain and inflammation, like ibuprofen or naproxen -probenecid -pyrimethamine -vaccines Talk to your doctor or health care professional before taking any of these medicines: -acetaminophen -aspirin -ibuprofen -ketoprofen -naproxen This list may not describe all possible interactions. Give your health care provider a list of all the medicines, herbs, non-prescription drugs, or dietary supplements you use. Also tell them if you smoke, drink alcohol, or use illegal drugs. Some items may interact with your medicine. What should I watch for while using this medicine? Visit your doctor for checks on your progress. This drug may make you feel generally unwell. This is not uncommon, as chemotherapy can affect healthy cells as well as cancer cells. Report any side effects. Continue your course of treatment even though you feel ill unless your doctor tells you to stop. In some cases, you may be given additional medicines to help with side effects. Follow all directions for their use. Call your doctor or health care professional for advice if you get a fever, chills or sore throat, or other symptoms of a cold or flu. Do not treat yourself. This drug decreases your body's ability to fight infections. Try to avoid being around people who are sick. This medicine may increase your risk to bruise or bleed. Call your doctor or health care professional if you notice any unusual bleeding. Be careful brushing and flossing your teeth or using a toothpick because  you may get an infection or bleed more easily. If you have any dental work done, tell your dentist you are receiving this medicine. Avoid taking products that contain aspirin, acetaminophen, ibuprofen, naproxen, or ketoprofen unless instructed by your doctor. These medicines may hide a fever. Call your doctor or health care  professional if you get diarrhea or mouth sores. Do not treat yourself. To protect your kidneys, drink water or other fluids as directed while you are taking this medicine. Men and women must use effective birth control while taking this medicine. You may also need to continue using effective birth control for a time after stopping this medicine. Do not become pregnant while taking this medicine. Tell your doctor right away if you think that you or your partner might be pregnant. There is a potential for serious side effects to an unborn child. Talk to your health care professional or pharmacist for more information. Do not breast-feed an infant while taking this medicine. This medicine may lower sperm counts. What side effects may I notice from receiving this medicine? Side effects that you should report to your doctor or health care professional as soon as possible: -allergic reactions like skin rash, itching or hives, swelling of the face, lips, or tongue -low blood counts - this medicine may decrease the number of white blood cells, red blood cells and platelets. You may be at increased risk for infections and bleeding. -signs of infection - fever or chills, cough, sore throat, pain or difficulty passing urine -signs of decreased platelets or bleeding - bruising, pinpoint red spots on the skin, black, tarry stools, blood in the urine -signs of decreased red blood cells - unusually weak or tired, fainting spells, lightheadedness -breathing problems, like a dry cough -changes in emotions or moods -chest pain -confusion -diarrhea -high blood pressure -mouth or throat sores or ulcers -pain, swelling, warmth in the leg -pain on swallowing -swelling of the ankles, feet, hands -trouble passing urine or change in the amount of urine -vomiting -yellowing of the eyes or skin Side effects that usually do not require medical attention (report to your doctor or health care professional if they continue  or are bothersome): -hair loss -loss of appetite -nausea -stomach upset This list may not describe all possible side effects. Call your doctor for medical advice about side effects. You may report side effects to FDA at 1-800-FDA-1088. Where should I keep my medicine? This drug is given in a hospital or clinic and will not be stored at home. NOTE: This sheet is a summary. It may not cover all possible information. If you have questions about this medicine, talk to your doctor, pharmacist, or health care provider.    2016, Elsevier/Gold Standard. (2007-12-09 13:24:03)       Chemotherapy Chemotherapy is the use of medicines to stop or slow the growth of cancer cells. Depending on the type and stage of your cancer, you may have chemotherapy to:  Cure your cancer.  Slow the progression of your cancer.  Ease your cancer symptoms.  Improve the benefits of radiation treatment.  Shrink a tumor before surgery.  Rid the body of cancer cells that remain after a tumor is surgically removed. HOW IS CHEMOTHERAPY GIVEN? Chemotherapy may be given:  By mouth in liquid or pill form.  Through a thin tube that is inserted into a vein or artery.  By getting a shot.  By rubbing a cream or ointment on your skin.  Through liquids that are placed directly into  various areas of the body, such as the abdomen, chest, or bladder. HOW OFTEN IS CHEMOTHERAPY GIVEN? Chemotherapy may be given continuously over time, or it may be given in cycles. For example, you may take the medicine for one week out of every month. FOR HOW LONG WILL I NEED CHEMOTHERAPY TREATMENTS? The length of treatment depends on many factors, including:  The type of cancer.  Whether the cancer has spread.  How you respond to the chemotherapy.  Whether you develop side effects. Some types of chemotherapy medicine are given only one time. Others are given for months, years, or for life. WHAT SAFETY PRECAUTIONS MUST I TAKE  WHILE ON CHEMOTHERAPY? Chemotherapy medicines are very strong. They will be in all of your bodily fluids, including your urine, stool, saliva, sweat, tears, vaginal secretions, and semen. You must carefully follow some safety precautions to prevent harm to others while you are using these medicines. Here are some recommended precautions:  Make sure that people who help care for you wear disposable gloves if they are going to come into contact with any of your bodily fluids. Women who are pregnant or breastfeeding should not handle any of your bodily fluids.  Wash any clothes, towels, and linens that may have your bodily fluids on them twice in a washing machine using very hot water.  Dispose of adult diapers, tampons, and sanitary napkins by first sealing them in a plastic bag.  Use a condom when having sex for at least 2 weeks after receiving your chemotherapy.  Do not share beverages or food.  Keep your chemotherapy medicines in their original bottles. Keep them in a high, safe location, away from children. Do not expose them to heat or moisture. Do not put them in containers with other types of medicines.  Dispose of all wrappers for your chemotherapy medicines by sealing them in a separate plastic bag.  Do not throw away extra medicine, and do not flush it down the toilet. Take medicine that you are not going to use to your health care provider's office where it can be disposed of properly.  Follow your health care provider's directions for the proper disposal of needles, IV tubing, and other medical supplies that have come into contact with your chemotherapy medicines.  If you are issued a hazardous waste container, make sure you understand the directions for using it.  Wash your hands thoroughly with warm water and soap after using the bathroom. Dry your hands with disposable paper towels.  When using the toilet:  Flush it twice after each use, including after vomiting.  Close the  lid of the toilet prior to flushing. This helps to avoid splashing.  Both men and women should sit to use the toilet. This helps avoid splashing. WHAT ARE THE SIDE EFFECTS OF CHEMOTHERAPY? Side effects depend on a variety of factors, including:  The specific type of chemotherapy medicine used.  The dosage.  How long the medicine is used for.  Your overall health. Some of the side effects you may experience include:  Fatigue and decreased energy.  Decreased appetite.  Changes in your sense of smell or taste.  Nausea.  Vomiting.  Constipation or diarrhea.  Hair loss.  Increased susceptibility to infection.  Easy bleeding.  Mouth sores.  Burning or tingling in the hands or feet.  Memory problems.   This information is not intended to replace advice given to you by your health care provider. Make sure you discuss any questions you have with your  health care provider.   Document Released: 03/04/2007 Document Revised: 05/28/2014 Document Reviewed: 10/13/2013 Elsevier Interactive Patient Education 2016 [place].        Carboplatin injection What is this medicine? CARBOPLATIN (KAR boe pla tin) is a chemotherapy drug. It targets fast dividing cells, like cancer cells, and causes these cells to die. This medicine is used to treat ovarian cancer and many other cancers. This medicine may be used for other purposes; ask your health care provider or pharmacist if you have questions. What should I tell my health care provider before I take this medicine? They need to know if you have any of these conditions: -blood disorders -hearing problems -kidney disease -recent or ongoing radiation therapy -an unusual or allergic reaction to carboplatin, cisplatin, other chemotherapy, other medicines, foods, dyes, or preservatives -pregnant or trying to get pregnant -breast-feeding How should I use this medicine? This drug is usually given as an infusion into a vein. It is  administered in a hospital or clinic by a specially trained health care professional. Talk to your pediatrician regarding the use of this medicine in children. Special care may be needed. Overdosage: If you think you have taken too much of this medicine contact a poison control center or emergency room at once. NOTE: This medicine is only for you. Do not share this medicine with others. What if I miss a dose? It is important not to miss a dose. Call your doctor or health care professional if you are unable to keep an appointment. What may interact with this medicine? -medicines for seizures -medicines to increase blood counts like filgrastim, pegfilgrastim, sargramostim -some antibiotics like amikacin, gentamicin, neomycin, streptomycin, tobramycin -vaccines Talk to your doctor or health care professional before taking any of these medicines: -acetaminophen -aspirin -ibuprofen -ketoprofen -naproxen This list may not describe all possible interactions. Give your health care provider a list of all the medicines, herbs, non-prescription drugs, or dietary supplements you use. Also tell them if you smoke, drink alcohol, or use illegal drugs. Some items may interact with your medicine. What should I watch for while using this medicine? Your condition will be monitored carefully while you are receiving this medicine. You will need important blood work done while you are taking this medicine. This drug may make you feel generally unwell. This is not uncommon, as chemotherapy can affect healthy cells as well as cancer cells. Report any side effects. Continue your course of treatment even though you feel ill unless your doctor tells you to stop. In some cases, you may be given additional medicines to help with side effects. Follow all directions for their use. Call your doctor or health care professional for advice if you get a fever, chills or sore throat, or other symptoms of a cold or flu. Do not  treat yourself. This drug decreases your body's ability to fight infections. Try to avoid being around people who are sick. This medicine may increase your risk to bruise or bleed. Call your doctor or health care professional if you notice any unusual bleeding. Be careful brushing and flossing your teeth or using a toothpick because you may get an infection or bleed more easily. If you have any dental work done, tell your dentist you are receiving this medicine. Avoid taking products that contain aspirin, acetaminophen, ibuprofen, naproxen, or ketoprofen unless instructed by your doctor. These medicines may hide a fever. Do not become pregnant while taking this medicine. Women should inform their doctor if they wish to become pregnant  or think they might be pregnant. There is a potential for serious side effects to an unborn child. Talk to your health care professional or pharmacist for more information. Do not breast-feed an infant while taking this medicine. What side effects may I notice from receiving this medicine? Side effects that you should report to your doctor or health care professional as soon as possible: -allergic reactions like skin rash, itching or hives, swelling of the face, lips, or tongue -signs of infection - fever or chills, cough, sore throat, pain or difficulty passing urine -signs of decreased platelets or bleeding - bruising, pinpoint red spots on the skin, black, tarry stools, nosebleeds -signs of decreased red blood cells - unusually weak or tired, fainting spells, lightheadedness -breathing problems -changes in hearing -changes in vision -chest pain -high blood pressure -low blood counts - This drug may decrease the number of white blood cells, red blood cells and platelets. You may be at increased risk for infections and bleeding. -nausea and vomiting -pain, swelling, redness or irritation at the injection site -pain, tingling, numbness in the hands or feet -problems  with balance, talking, walking -trouble passing urine or change in the amount of urine Side effects that usually do not require medical attention (report to your doctor or health care professional if they continue or are bothersome): -hair loss -loss of appetite -metallic taste in the mouth or changes in taste This list may not describe all possible side effects. Call your doctor for medical advice about side effects. You may report side effects to FDA at 1-800-FDA-1088. Where should I keep my medicine? This drug is given in a hospital or clinic and will not be stored at home. NOTE: This sheet is a summary. It may not cover all possible information. If you have questions about this medicine, talk to your doctor, pharmacist, or health care provider.    2016, Elsevier/Gold Standard. (2007-08-12 14:38:05)

## 2015-09-27 NOTE — Progress Notes (Signed)
30 minutes spent educating patient and caregiver re: new chemotherapy regimen (carbo/alimita). Discussed the purpose of frequent b12 injections and folic acid 1 mg daily. I explained that Alimita can cause the M35 levels and folic acids to drop. We will provide the first b12 injection when patient comes to chemotherapy class next week.  I explained the purpose of the chemotherapy class and port a ath.  Reviewed the application/administration instructions patient medications (EMLA cream, Compazine, Zofran, Claritin D OTC, imodium, stool softeners) with the patient and caregiver.   Patient currently does not have any stool softeners/laxatives or imodium in medication cabinet at home.  Discussed patient's concerns on maintaining oral hygiene and nutrion with patient.  Pt has upper dentures and partial lower dentures. Pt has frequent problems with mouth ulcers & dry mouth. No mouth ulcers present today.  Discussed in length regarding maintaining good oral hygiene. He was instructed to brush/floss with lower teeth/gums after every meal. I encouraged him to use baking soda/water mouth wash two to three times a day to prevent mouth sores.  Pt also admits to 'eating spicy foods frequently throughout the week. It's the only thing that I really like to eat anymore. I just live off spicy [employer] burritos."  I asked pt to avoid spicy foods as much as possible to avoid mucositits.  I explained that spicy foods breakdown the new formed cells in the mouth/oral mucosa. He also admitted to GERD/nausea at bedtime, which worsens at bedtime "when I lie down."  I encouraged patient to take his antiemetic starting today or an over the counter acid reflux tablet to help with the GERD.  Teach back process performed.  At the end of the visit, the patient requested a dietary consult to managed his decreased appetite.
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
## 2015-09-28 ENCOUNTER — Telehealth: Payer: Self-pay | Admitting: Internal Medicine

## 2015-09-28 NOTE — Telephone Encounter (Signed)
Patient's family wants to move him to [place], [place] during his treatment so he can be with them. He has asked [place] to cancel all upcoming appointments and send a referral to Dr. Amelia Jo at [facility]. I will fax the referral and wanted to let you know. Thanks. Dr. Philip Aspen phone: [phone] Dr. Philip Aspen fax: [phone]

## 2015-09-28 NOTE — Progress Notes (Signed)
I was informed today- that patient wants to cancel his appointments here; wants to move to [place], [place].

## 2015-09-28 NOTE — Telephone Encounter (Signed)
Although he is transferring care to [facility], he is in a lot of pain. Can you prescribe something for his pain in the interim between now and getting established with new physician? Thank you.

## 2015-09-29 ENCOUNTER — Other Ambulatory Visit: Payer: Self-pay | Admitting: Internal Medicine

## 2015-09-29 DIAGNOSIS — C3491 Malignant neoplasm of unspecified part of right bronchus or lung: Secondary | ICD-10-CM

## 2015-09-29 MED ORDER — HYDROCODONE-ACETAMINOPHEN 10-325 MG PO TABS: 1.0000 | ORAL_TABLET | Freq: Four times a day (QID) | ORAL | Status: DC | PRN

## 2015-09-29 NOTE — Telephone Encounter (Signed)
I received a confirmation page on the fax machine that my referral went through successfully to Dr. Philip Aspen office yesterday, 09/28/15 at 4:28pm. I will resend again today if they said they do not have it.

## 2015-09-29 NOTE — Telephone Encounter (Signed)
Jared Jefferson Spoke to Dr. Shon Baton this morning regarding patient's referral. He was still awaiting on the paperwork. Please have pt call Dr.watsons' office ASAP.

## 2015-09-29 NOTE — Telephone Encounter (Signed)
Becky at Dr. Philip Aspen office said they have him scheduled for next Tuesday, 10/04/15 @ 2:30pm. She will contact patient/family w/appt. Thanks.

## 2015-09-29 NOTE — Telephone Encounter (Signed)
Jared Jefferson- I printed a prescription for Norco 10/325 every 6 hours #50 pills. Patient can pick it up from [facility] office. However he would need to follow-up with his new doctor for further refills/new prescriptions. He needs to make appt with Dr.watson asap. He needs to get started on treatment for his pain control.

## 2015-09-29 NOTE — Telephone Encounter (Signed)
Spoke with Jared Jefferson at Dr. Philip Aspen office and they did receive the referral yesterday.

## 2015-10-04 ENCOUNTER — Other Ambulatory Visit: Payer: Medicare Other

## 2015-10-04 ENCOUNTER — Telehealth: Payer: Self-pay | Admitting: Internal Medicine

## 2015-10-04 ENCOUNTER — Ambulatory Visit: Payer: Medicare Other

## 2015-10-04 NOTE — Telephone Encounter (Signed)
They need [place] to send them the disc with his images. Please send via [employer] ASAP to:  Dr. Amelia Jo [address], [place], [place] [address]   If you have any questions you can call [facility] [place]: [phone] (and fax is: [phone])  Thanks!

## 2015-10-05 ENCOUNTER — Telehealth: Payer: Self-pay | Admitting: Internal Medicine

## 2015-10-05 NOTE — Telephone Encounter (Signed)
msg routed to [facility] records to send CD imaging

## 2015-10-05 NOTE — Telephone Encounter (Signed)
Dr. Rogue Bussing contacted pathology to discuss adding this test to the patient's pathology.

## 2015-10-05 NOTE — Telephone Encounter (Signed)
Heather- please follow up on the note from [place]. Thx

## 2015-10-05 NOTE — Telephone Encounter (Signed)
She said Dr. Shon Baton still needs [place] to send the TTF1 on Jared Jefferson. She said if you open the broncial test and read the notes the other test is referenced in that and to please send it to them at fax: [phone]. If you have any questions, call [facility] [place] at [phone]. Thanks!

## 2015-10-07 ENCOUNTER — Ambulatory Visit: Payer: Medicare Other | Admitting: Internal Medicine

## 2015-10-07 ENCOUNTER — Ambulatory Visit: Payer: Medicare Other

## 2015-10-07 ENCOUNTER — Other Ambulatory Visit: Payer: Medicare Other

## 2015-10-19 ENCOUNTER — Ambulatory Visit: Payer: Medicare Other

## 2015-10-27 ENCOUNTER — Ambulatory Visit: Payer: Medicare Other | Admitting: Dietician

## 2016-06-05 ENCOUNTER — Emergency Department: Payer: Medicare Other

## 2016-06-05 ENCOUNTER — Observation Stay
Admission: EM | Admit: 2016-06-05 | Discharge: 2016-06-08 | Disposition: A | Discharge status: Home / Self Care | Payer: Medicare Other | Attending: Internal Medicine | Admitting: Internal Medicine

## 2016-06-05 DIAGNOSIS — Z87891 Personal history of nicotine dependence: Secondary | ICD-10-CM | POA: Diagnosis not present

## 2016-06-05 DIAGNOSIS — G9340 Encephalopathy, unspecified: Secondary | ICD-10-CM | POA: Insufficient documentation

## 2016-06-05 DIAGNOSIS — J449 Chronic obstructive pulmonary disease, unspecified: Secondary | ICD-10-CM | POA: Insufficient documentation

## 2016-06-05 DIAGNOSIS — E1151 Type 2 diabetes mellitus with diabetic peripheral angiopathy without gangrene: Secondary | ICD-10-CM | POA: Insufficient documentation

## 2016-06-05 DIAGNOSIS — C771 Secondary and unspecified malignant neoplasm of intrathoracic lymph nodes: Secondary | ICD-10-CM | POA: Insufficient documentation

## 2016-06-05 DIAGNOSIS — Z791 Long term (current) use of non-steroidal anti-inflammatories (NSAID): Secondary | ICD-10-CM | POA: Insufficient documentation

## 2016-06-05 DIAGNOSIS — F419 Anxiety disorder, unspecified: Secondary | ICD-10-CM | POA: Diagnosis not present

## 2016-06-05 DIAGNOSIS — R4182 Altered mental status, unspecified: Secondary | ICD-10-CM | POA: Diagnosis present

## 2016-06-05 DIAGNOSIS — C3411 Malignant neoplasm of upper lobe, right bronchus or lung: Secondary | ICD-10-CM | POA: Insufficient documentation

## 2016-06-05 DIAGNOSIS — I509 Heart failure, unspecified: Secondary | ICD-10-CM | POA: Diagnosis not present

## 2016-06-05 DIAGNOSIS — C787 Secondary malignant neoplasm of liver and intrahepatic bile duct: Secondary | ICD-10-CM | POA: Insufficient documentation

## 2016-06-05 DIAGNOSIS — Z7984 Long term (current) use of oral hypoglycemic drugs: Secondary | ICD-10-CM | POA: Diagnosis not present

## 2016-06-05 DIAGNOSIS — Z7951 Long term (current) use of inhaled steroids: Secondary | ICD-10-CM | POA: Diagnosis not present

## 2016-06-05 DIAGNOSIS — Z951 Presence of aortocoronary bypass graft: Secondary | ICD-10-CM | POA: Insufficient documentation

## 2016-06-05 DIAGNOSIS — Z7902 Long term (current) use of antithrombotics/antiplatelets: Secondary | ICD-10-CM | POA: Diagnosis not present

## 2016-06-05 DIAGNOSIS — Z9981 Dependence on supplemental oxygen: Secondary | ICD-10-CM | POA: Diagnosis not present

## 2016-06-05 DIAGNOSIS — Y95 Nosocomial condition: Secondary | ICD-10-CM | POA: Insufficient documentation

## 2016-06-05 DIAGNOSIS — I2510 Atherosclerotic heart disease of native coronary artery without angina pectoris: Secondary | ICD-10-CM | POA: Diagnosis not present

## 2016-06-05 DIAGNOSIS — Z7982 Long term (current) use of aspirin: Secondary | ICD-10-CM | POA: Insufficient documentation

## 2016-06-05 DIAGNOSIS — Z79899 Other long term (current) drug therapy: Secondary | ICD-10-CM | POA: Insufficient documentation

## 2016-06-05 DIAGNOSIS — I110 Hypertensive heart disease with heart failure: Secondary | ICD-10-CM | POA: Insufficient documentation

## 2016-06-05 DIAGNOSIS — Z6823 Body mass index (BMI) 23.0-23.9, adult: Secondary | ICD-10-CM | POA: Insufficient documentation

## 2016-06-05 DIAGNOSIS — R400 Somnolence: Secondary | ICD-10-CM | POA: Diagnosis present

## 2016-06-05 DIAGNOSIS — E669 Obesity, unspecified: Secondary | ICD-10-CM | POA: Diagnosis not present

## 2016-06-05 DIAGNOSIS — I252 Old myocardial infarction: Secondary | ICD-10-CM | POA: Diagnosis not present

## 2016-06-05 DIAGNOSIS — M6281 Muscle weakness (generalized): Secondary | ICD-10-CM

## 2016-06-05 DIAGNOSIS — J189 Pneumonia, unspecified organism: Principal | ICD-10-CM

## 2016-06-05 LAB — COMPREHENSIVE METABOLIC PANEL
ALBUMIN: 3.1 g/dL — AB (ref 3.5–5.0)
ALK PHOS: 144 U/L — AB (ref 38–126)
ALT: 39 U/L (ref 17–63)
ANION GAP: 9 (ref 5–15)
AST: 110 U/L — ABNORMAL HIGH (ref 15–41)
BILIRUBIN TOTAL: 0.9 mg/dL (ref 0.3–1.2)
BUN: 31 mg/dL — ABNORMAL HIGH (ref 6–20)
CALCIUM: 11.5 mg/dL — AB (ref 8.9–10.3)
CO2: 28 mmol/L (ref 22–32)
Chloride: 98 mmol/L — ABNORMAL LOW (ref 101–111)
Creatinine, Ser: 1.15 mg/dL (ref 0.61–1.24)
GFR calc non Af Amer: >60 mL/min (ref >60)
GLUCOSE: 110 mg/dL — AB (ref 65–99)
POTASSIUM: 4.6 mmol/L (ref 3.5–5.1)
SODIUM: 135 mmol/L (ref 135–145)
TOTAL PROTEIN: 7.1 g/dL (ref 6.5–8.1)

## 2016-06-05 LAB — CBC WITH DIFFERENTIAL/PLATELET
BASOS PCT: 0 %
Basophils Absolute: 0 10*3/uL (ref 0–0.1)
Eosinophils Absolute: 0.1 10*3/uL (ref 0–0.7)
Eosinophils Relative: 1 %
HCT: 34.6 % — ABNORMAL LOW (ref 40.0–52.0)
HEMOGLOBIN: 11.5 g/dL — AB (ref 13.0–18.0)
LYMPHS ABS: 1 10*3/uL (ref 1.0–3.6)
Lymphocytes Relative: 15 %
MCH: 29.4 pg (ref 26.0–34.0)
MCHC: 33.2 g/dL (ref 32.0–36.0)
MCV: 88.7 fL (ref 80.0–100.0)
MONO ABS: 0.7 10*3/uL (ref 0.2–1.0)
MONOS PCT: 11 %
Neutro Abs: 5 10*3/uL (ref 1.4–6.5)
Neutrophils Relative %: 73 %
Platelets: 140 10*3/uL — ABNORMAL LOW (ref 150–440)
RBC: 3.9 MIL/uL — ABNORMAL LOW (ref 4.40–5.90)
RDW: 15.6 % — AB (ref 11.5–14.5)
WBC: 6.8 10*3/uL (ref 3.8–10.6)

## 2016-06-05 LAB — TROPONIN I: Troponin I: 0.13 ng/mL (ref <0.03)

## 2016-06-05 MED ORDER — CEFEPIME-DEXTROSE 2 GM/50ML IV SOLR
2.0000 g | Freq: Once | INTRAVENOUS | Status: AC
  Administered 2016-06-06: 2 g via INTRAVENOUS
  Filled 2016-06-05: qty 50

## 2016-06-05 MED ORDER — ALBUTEROL SULFATE (2.5 MG/3ML) 0.083% IN NEBU
5.0000 mg | INHALATION_SOLUTION | Freq: Once | RESPIRATORY_TRACT | Status: AC
  Administered 2016-06-05: 5 mg via RESPIRATORY_TRACT
  Filled 2016-06-05: qty 6

## 2016-06-05 MED ORDER — METHYLPREDNISOLONE SODIUM SUCC 125 MG IJ SOLR
125.0000 mg | Freq: Once | INTRAMUSCULAR | Status: AC
  Administered 2016-06-05: 125 mg via INTRAVENOUS
  Filled 2016-06-05: qty 2

## 2016-06-05 MED ORDER — VANCOMYCIN HCL IN DEXTROSE 1-5 GM/200ML-% IV SOLN
1000.0000 mg | Freq: Once | INTRAVENOUS | Status: AC
  Administered 2016-06-06: 1000 mg via INTRAVENOUS
  Filled 2016-06-05: qty 200

## 2016-06-05 NOTE — ED Triage Notes (Signed)
Pt bib EMS from home w/ c/o AMS, abnormal lab value and weakness.  Per EMS pt has hx of liver and lung CA, being seen by specialist in [place].  Pt on 2L at home.  Per EMS, pt saw specialist today, received call earlier regarding abnormal elevated calcium level.  Per EMS, pts family also concerned about pts AMS.  EMS removed fentynl patch that pt stated he applied today because pt was having decreased LOC.  Pt lethargic in triage, speech slurred.
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
## 2016-06-05 NOTE — Progress Notes (Signed)
Pharmacy Antibiotic Note  Jared Jefferson is a 71 y.o. male admitted on 06/05/2016 with pneumonia.  Pharmacy has been consulted for cefepime and vancomycin dosing.  Plan: 1. Cefepime 1 gm IV Q12H (for CrCl 30 to 60 mL/min) 2. Vancomycin 1000 mg IV x 1 in ED followed in approximately 6 hours (stacked dosing) by vancomycin 750 mg IV Q12H, predicted trough 18 mcg/mL. Pharmacy will continue to follow and adjust as needed to maintain trough 15 to 20 mcg/mL.   Vd 50.8 L, Ke 0.051 hr-1, T1/2 13.5 hr  Height: 5' 7.01" (170.2 cm) Weight: 160 lb (72.6 kg) IBW/kg (Calculated) : 66.12  Temp (24hrs), Avg:97.6 F (36.4 C), Min:97.6 F (36.4 C), Max:97.6 F (36.4 C)   Recent Labs Lab 06/05/16 2224  WBC 6.8  CREATININE 1.15    Estimated Creatinine Clearance: 56.7 mL/min (by C-G formula based on SCr of 1.15 mg/dL).    Allergies  Allergen Reactions  . Iodine Anaphylaxis  . Iohexol Anaphylaxis  . Shellfish Allergy Anaphylaxis    Thank you for allowing pharmacy to be a part of this patient's care.  Laural Benes, Pharm.D., BCPS Clinical Pharmacist 06/05/2016 11:52 PM

## 2016-06-06 DIAGNOSIS — J189 Pneumonia, unspecified organism: Secondary | ICD-10-CM | POA: Diagnosis not present

## 2016-06-06 DIAGNOSIS — R400 Somnolence: Secondary | ICD-10-CM | POA: Diagnosis present

## 2016-06-06 LAB — GLUCOSE, CAPILLARY
Glucose-Capillary: 257 mg/dL — ABNORMAL HIGH (ref 65–99)
Glucose-Capillary: 191 mg/dL — ABNORMAL HIGH (ref 65–99)
Glucose-Capillary: 132 mg/dL — ABNORMAL HIGH (ref 65–99)
Glucose-Capillary: 218 mg/dL — ABNORMAL HIGH (ref 65–99)

## 2016-06-06 LAB — MRSA PCR SCREENING: MRSA by PCR: NEGATIVE

## 2016-06-06 LAB — CK: Total CK: 33 U/L — ABNORMAL LOW (ref 49–397)

## 2016-06-06 LAB — BASIC METABOLIC PANEL
Anion gap: 11 (ref 5–15)
BUN: 28 mg/dL — AB (ref 6–20)
CHLORIDE: 96 mmol/L — AB (ref 101–111)
CO2: 27 mmol/L (ref 22–32)
CREATININE: 1.15 mg/dL (ref 0.61–1.24)
Calcium: 11.2 mg/dL — ABNORMAL HIGH (ref 8.9–10.3)
GFR calc Af Amer: >60 mL/min (ref >60)
GFR calc non Af Amer: >60 mL/min (ref >60)
GLUCOSE: 335 mg/dL — AB (ref 65–99)
POTASSIUM: 4.1 mmol/L (ref 3.5–5.1)
SODIUM: 134 mmol/L — AB (ref 135–145)

## 2016-06-06 LAB — INFLUENZA PANEL BY PCR (TYPE A & B)
Influenza A By PCR: NEGATIVE
Influenza B By PCR: NEGATIVE

## 2016-06-06 LAB — TSH: TSH: 0.328 u[IU]/mL — ABNORMAL LOW (ref 0.350–4.500)

## 2016-06-06 LAB — TROPONIN I
Troponin I: 0.06 ng/mL (ref <0.03)
Troponin I: 0.04 ng/mL (ref <0.03)
Troponin I: 0.04 ng/mL (ref <0.03)

## 2016-06-06 LAB — ETHANOL: Alcohol, Ethyl (B): <5 mg/dL (ref <5)

## 2016-06-06 LAB — PHOSPHORUS: Phosphorus: 3.2 mg/dL (ref 2.5–4.6)

## 2016-06-06 LAB — AMMONIA: Ammonia: 14 umol/L (ref 9–35)

## 2016-06-06 MED ORDER — METOPROLOL SUCCINATE ER 50 MG PO TB24
100.0000 mg | ORAL_TABLET | Freq: Every day | ORAL | Status: DC
  Administered 2016-06-06 – 2016-06-08 (×3): 100 mg via ORAL
  Filled 2016-06-06 (×3): qty 2

## 2016-06-06 MED ORDER — ONDANSETRON HCL 4 MG/2ML IJ SOLN: 4.0000 mg | Freq: Four times a day (QID) | INTRAMUSCULAR | Status: DC | PRN

## 2016-06-06 MED ORDER — ALBUTEROL SULFATE (2.5 MG/3ML) 0.083% IN NEBU
2.5000 mg | INHALATION_SOLUTION | Freq: Three times a day (TID) | RESPIRATORY_TRACT | Status: DC
  Administered 2016-06-06 – 2016-06-08 (×6): 2.5 mg via RESPIRATORY_TRACT
  Filled 2016-06-06 (×6): qty 3

## 2016-06-06 MED ORDER — OXYCODONE HCL 5 MG PO TABS
5.0000 mg | ORAL_TABLET | Freq: Four times a day (QID) | ORAL | Status: DC | PRN
  Administered 2016-06-06 (×2): 5 mg via ORAL
  Filled 2016-06-06 (×2): qty 1

## 2016-06-06 MED ORDER — MOMETASONE FURO-FORMOTEROL FUM 200-5 MCG/ACT IN AERO
2.0000 | INHALATION_SPRAY | Freq: Two times a day (BID) | RESPIRATORY_TRACT | Status: DC
  Administered 2016-06-06 – 2016-06-08 (×5): 2 via RESPIRATORY_TRACT
  Filled 2016-06-06: qty 8.8

## 2016-06-06 MED ORDER — DEXTROSE 5 % IV SOLN
250.0000 mg | INTRAVENOUS | Status: DC
  Administered 2016-06-06: 250 mg via INTRAVENOUS
  Filled 2016-06-06 (×2): qty 250

## 2016-06-06 MED ORDER — INSULIN ASPART 100 UNIT/ML ~~LOC~~ SOLN
0.0000 [IU] | Freq: Every day | SUBCUTANEOUS | Status: DC
  Administered 2016-06-06: 2 [IU] via SUBCUTANEOUS
  Filled 2016-06-06: qty 2

## 2016-06-06 MED ORDER — CEFEPIME-DEXTROSE 1 GM/50ML IV SOLR
1.0000 g | Freq: Two times a day (BID) | INTRAVENOUS | Status: DC
  Administered 2016-06-06: 1 g via INTRAVENOUS
  Filled 2016-06-06 (×2): qty 50

## 2016-06-06 MED ORDER — CLOPIDOGREL BISULFATE 75 MG PO TABS
75.0000 mg | ORAL_TABLET | Freq: Every day | ORAL | Status: DC
  Filled 2016-06-06: qty 1

## 2016-06-06 MED ORDER — ACETAMINOPHEN 650 MG RE SUPP: 650.0000 mg | Freq: Four times a day (QID) | RECTAL | Status: DC | PRN

## 2016-06-06 MED ORDER — ACETAMINOPHEN 325 MG PO TABS: 650.0000 mg | ORAL_TABLET | Freq: Four times a day (QID) | ORAL | Status: DC | PRN

## 2016-06-06 MED ORDER — ALBUTEROL SULFATE (2.5 MG/3ML) 0.083% IN NEBU
2.5000 mg | INHALATION_SOLUTION | Freq: Four times a day (QID) | RESPIRATORY_TRACT | Status: DC
  Administered 2016-06-06: 2.5 mg via RESPIRATORY_TRACT
  Filled 2016-06-06: qty 3

## 2016-06-06 MED ORDER — PREDNISONE 50 MG PO TABS
50.0000 mg | ORAL_TABLET | Freq: Every day | ORAL | Status: DC
  Administered 2016-06-06 – 2016-06-08 (×3): 50 mg via ORAL
  Filled 2016-06-06 (×3): qty 1

## 2016-06-06 MED ORDER — ALBUTEROL SULFATE (2.5 MG/3ML) 0.083% IN NEBU
2.5000 mg | INHALATION_SOLUTION | RESPIRATORY_TRACT | Status: DC
  Administered 2016-06-06 (×2): 2.5 mg via RESPIRATORY_TRACT
  Filled 2016-06-06 (×2): qty 3

## 2016-06-06 MED ORDER — ORAL CARE MOUTH RINSE
15.0000 mL | Freq: Two times a day (BID) | OROMUCOSAL | Status: DC
  Administered 2016-06-06 – 2016-06-07 (×3): 15 mL via OROMUCOSAL

## 2016-06-06 MED ORDER — INSULIN ASPART 100 UNIT/ML ~~LOC~~ SOLN
0.0000 [IU] | Freq: Three times a day (TID) | SUBCUTANEOUS | Status: DC
  Administered 2016-06-06: 8 [IU] via SUBCUTANEOUS
  Administered 2016-06-06: 2 [IU] via SUBCUTANEOUS
  Administered 2016-06-06: 3 [IU] via SUBCUTANEOUS
  Administered 2016-06-07 (×2): 2 [IU] via SUBCUTANEOUS
  Administered 2016-06-07 – 2016-06-08 (×2): 3 [IU] via SUBCUTANEOUS
  Administered 2016-06-08: 2 [IU] via SUBCUTANEOUS
  Filled 2016-06-06: qty 2
  Filled 2016-06-06: qty 3
  Filled 2016-06-06 (×3): qty 2
  Filled 2016-06-06: qty 8
  Filled 2016-06-06: qty 2
  Filled 2016-06-06: qty 3

## 2016-06-06 MED ORDER — CEFEPIME-DEXTROSE 1 GM/50ML IV SOLR
1.0000 g | Freq: Every day | INTRAVENOUS | Status: DC
  Administered 2016-06-06 – 2016-06-07 (×2): 1 g via INTRAVENOUS
  Filled 2016-06-06 (×2): qty 50

## 2016-06-06 MED ORDER — NITROGLYCERIN 0.4 MG SL SUBL: 0.4000 mg | SUBLINGUAL_TABLET | SUBLINGUAL | Status: DC | PRN

## 2016-06-06 MED ORDER — ONDANSETRON HCL 4 MG PO TABS: 4.0000 mg | ORAL_TABLET | Freq: Four times a day (QID) | ORAL | Status: DC | PRN

## 2016-06-06 MED ORDER — CLONAZEPAM 0.5 MG PO TABS
1.0000 mg | ORAL_TABLET | Freq: Every day | ORAL | Status: DC
  Administered 2016-06-06 – 2016-06-07 (×2): 1 mg via ORAL
  Filled 2016-06-06 (×2): qty 2

## 2016-06-06 MED ORDER — ENOXAPARIN SODIUM 40 MG/0.4ML ~~LOC~~ SOLN
40.0000 mg | SUBCUTANEOUS | Status: DC
  Filled 2016-06-06 (×2): qty 0.4

## 2016-06-06 MED ORDER — ASPIRIN EC 81 MG PO TBEC
81.0000 mg | DELAYED_RELEASE_TABLET | Freq: Every day | ORAL | Status: DC
  Administered 2016-06-06: 81 mg via ORAL
  Filled 2016-06-06 (×2): qty 1

## 2016-06-06 MED ORDER — DEXTROSE 5 % IV SOLN
500.0000 mg | Freq: Once | INTRAVENOUS | Status: AC
  Administered 2016-06-06: 500 mg via INTRAVENOUS
  Filled 2016-06-06: qty 500

## 2016-06-06 MED ORDER — MORPHINE SULFATE (PF) 2 MG/ML IV SOLN
2.0000 mg | INTRAVENOUS | Status: DC | PRN
  Administered 2016-06-06: 2 mg via INTRAVENOUS
  Filled 2016-06-06: qty 1

## 2016-06-06 MED ORDER — SODIUM CHLORIDE 0.9 % IV SOLN
INTRAVENOUS | Status: DC
  Administered 2016-06-06 – 2016-06-08 (×4): via INTRAVENOUS

## 2016-06-06 MED ORDER — VANCOMYCIN HCL IN DEXTROSE 750-5 MG/150ML-% IV SOLN
750.0000 mg | Freq: Two times a day (BID) | INTRAVENOUS | Status: DC
  Filled 2016-06-06 (×2): qty 150

## 2016-06-06 MED ORDER — DOCUSATE SODIUM 100 MG PO CAPS
100.0000 mg | ORAL_CAPSULE | Freq: Two times a day (BID) | ORAL | Status: DC
  Administered 2016-06-06 – 2016-06-08 (×5): 100 mg via ORAL
  Filled 2016-06-06 (×6): qty 1

## 2016-06-06 NOTE — Care Management Obs Status (Signed)
[place] NOTIFICATION   Patient Details  Name: Jared Jefferson MRN: [id number] Date of Birth: Dec 05, 1945   Medicare Observation Status Notification Given:  Yes  Notified patient, brother Verdell Face by phone and cousin [name] by phone. Declined to sign.   [name], RN 06/06/2016, 10:26 AM

## 2016-06-06 NOTE — Care Management Note (Addendum)
Case Management Note  Patient Details  Name: Jared Jefferson MRN: [id number] Date of Birth: August 08, 1945  Subjective/Objective:                   Met with patient and he was on the phone with his cousin [name] [phone] that lives in [place]. Patient's brother lives in [place] [phone] and I later talked to him too about discharge planning. Patient is from [place] and all his care including cancer treatment is in [place]. Patient lives here and travels by cousin [name] and other family to appointments. He is pending liver biopsy to determine treatment. Patient is mostly independent but unable to shower "due to vertigo". He has declined in health and increased weakness but wishes to remain here in in [place]. He has to option to live with his brother [name] though. He is on chronic O2 but they are not sure what company provides.  Action/Plan: List of home health agencies left with patient. Medicare OBS form left with patient. I am checking to see what home health agency can cover this area and [place].   Expected Discharge Date:  06/06/16               Expected Discharge Plan:     In-House Referral:  Clinical Social Work  Discharge planning Services  CM Consult  Post Acute Care Choice:  Durable Medical Equipment, Home Health Choice offered to:  Patient, Sibling  DME Arranged:    DME Agency:     HH Arranged:    [place] Agency:     Status of Service:  In process, will continue to follow  If discussed at Long Length of Stay Meetings, dates discussed:    Additional Comments:  [name], RN 06/06/2016, 10:17 AM
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
## 2016-06-06 NOTE — Progress Notes (Signed)
New Admit  Arrival Method: from ED Mental Orientation: Alert and Oriented X4, Poor attention. Drowsiness, but arouses to noise. Assessment: Pt has productive cough. Diminished wheezes on expiration auscultated bilaterally. S1 and S2 heard. Negative for flu and MRSA. Negative for Ethanol. Impaired hearing. Skin: Dry, Sacral dressing applied. Iv: 22 G right hand Pain: Denies Safety Measures: Yellow socks, Bed alarm Admission: Complete 1A Orientation: Complete

## 2016-06-06 NOTE — Progress Notes (Signed)
Patient refused blood thinners, stated he was having a biopsy done on Monday and was told to hold these medications. Will clarify with MD

## 2016-06-06 NOTE — Progress Notes (Signed)
Initial Nutrition Assessment  DOCUMENTATION CODES:   Severe malnutrition in context of chronic illness  INTERVENTION:  1. 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 He reports good appetite. Ate Eggs, Grits, Kerry Dory this morning. 100% documented meal completion. Weight loss has been severe, 36#/19% over 10 months. Reports some days he eats everything he sees, and others he eats very little. Reports normally consuming 2-3 meals per day No nausea/vomiting On Nasal Cannula - but non-compliant at times.  Labs and medications reviewed: CBGs 191-257 NS @ 151m/hr  Diet Order:  Diet heart healthy/carb modified Room service appropriate? Yes; Fluid consistency: Thin  Skin:  Reviewed, no issues  Last BM:  PTA  Height:   Ht Readings from Last 1 Encounters:  06/05/16 5' 7.01" (1.702 m)    Weight:   Wt Readings from Last 1 Encounters:  06/06/16 149 lb 6.4 oz (67.8 kg)    Ideal Body Weight:  67.27 kg  BMI:  Body mass index is 23.39 kg/m.  Estimated Nutritional Needs:   Kcal:  10370-4888calories (30-35 cal/kg)  Protein:  81-106 gm  Fluid:  >/= 1.9L  EDUCATION NEEDS:   No education needs identified at this time  WSatira Anis Helmi Hechavarria, MS, RD LDN Inpatient Clinical Dietitian Pager [phone]

## 2016-06-06 NOTE — Care Management (Addendum)
[facility] can cover patient for home health services in both [place] and [place]. Brother Pink updated. Home health orders needed for RN, PT, SW, NA. Dr. Darvin Neighbours updated of this need.

## 2016-06-07 DIAGNOSIS — J189 Pneumonia, unspecified organism: Secondary | ICD-10-CM | POA: Diagnosis not present

## 2016-06-07 LAB — GLUCOSE, CAPILLARY
Glucose-Capillary: 142 mg/dL — ABNORMAL HIGH (ref 65–99)
Glucose-Capillary: 121 mg/dL — ABNORMAL HIGH (ref 65–99)
Glucose-Capillary: 152 mg/dL — ABNORMAL HIGH (ref 65–99)
Glucose-Capillary: 181 mg/dL — ABNORMAL HIGH (ref 65–99)

## 2016-06-07 LAB — BASIC METABOLIC PANEL
ANION GAP: 6 (ref 5–15)
BUN: 24 mg/dL — ABNORMAL HIGH (ref 6–20)
CALCIUM: 10.4 mg/dL — AB (ref 8.9–10.3)
CO2: 28 mmol/L (ref 22–32)
Chloride: 105 mmol/L (ref 101–111)
Creatinine, Ser: 0.92 mg/dL (ref 0.61–1.24)
GFR calc non Af Amer: >60 mL/min (ref >60)
Glucose, Bld: 180 mg/dL — ABNORMAL HIGH (ref 65–99)
Potassium: 3.7 mmol/L (ref 3.5–5.1)
SODIUM: 139 mmol/L (ref 135–145)

## 2016-06-07 MED ORDER — AZITHROMYCIN 500 MG PO TABS
500.0000 mg | ORAL_TABLET | Freq: Every day | ORAL | Status: DC
  Administered 2016-06-07: 500 mg via ORAL
  Filled 2016-06-07: qty 1

## 2016-06-07 MED ORDER — CARISOPRODOL 350 MG PO TABS
350.0000 mg | ORAL_TABLET | Freq: Two times a day (BID) | ORAL | Status: DC
  Administered 2016-06-07 – 2016-06-08 (×3): 350 mg via ORAL
  Filled 2016-06-07 (×3): qty 1

## 2016-06-07 MED ORDER — PREDNISONE 50 MG PO TABS: 50.0000 mg | ORAL_TABLET | Freq: Every day | ORAL | 0 refills | Status: AC

## 2016-06-07 MED ORDER — AMOXICILLIN-POT CLAVULANATE 875-125 MG PO TABS: 1.0000 | ORAL_TABLET | Freq: Two times a day (BID) | ORAL | 0 refills | Status: AC

## 2016-06-07 MED ORDER — CEFEPIME-DEXTROSE 1 GM/50ML IV SOLR
1.0000 g | Freq: Three times a day (TID) | INTRAVENOUS | Status: DC
  Administered 2016-06-07 – 2016-06-08 (×4): 1 g via INTRAVENOUS
  Filled 2016-06-07 (×7): qty 50

## 2016-06-07 MED ORDER — FENTANYL 50 MCG/HR TD PT72
50.0000 ug | MEDICATED_PATCH | TRANSDERMAL | Status: DC
  Administered 2016-06-07: 50 ug via TRANSDERMAL
  Filled 2016-06-07: qty 1

## 2016-06-07 NOTE — Evaluation (Signed)
Physical Therapy Evaluation Patient Details Name: Jared Jefferson MRN: [id number] DOB: 27-Aug-1945 Today's Date: 06/07/2016   History of Present Illness  Patient presents with 3 days of somnolence and confusion. He has lost ~50 pounds this year, has non-small cell lung cancer with liver mets.   Clinical Impression  Patient is able to ambulate a full lap around the RN station on this date with no real dyspnea on exertion noted, though he reports at baseline even with his O2 he gets winded from short gait bout. He has higher level balance deficits with head turns while ambulating, otherwise no real mobility deficits noted at this time. He would likely benefit from HHPT or some outpatient form of reconditioning program.     Follow Up Recommendations Home health PT (Or lung works if he is able to make out of house commutes)    Equipment Recommendations       Recommendations for Other Services       Precautions / Restrictions Precautions Precautions: Fall Restrictions Weight Bearing Restrictions: No      Mobility  Bed Mobility Overal bed mobility: Independent             General bed mobility comments: No deficits noted with bed mobility.   Transfers Overall transfer level: Modified independent               General transfer comment: Patient uses bed rails to complete sit to stand transfer.   Ambulation/Gait Ambulation/Gait assistance: Supervision Ambulation Distance (Feet): 200 Feet Assistive device: None Gait Pattern/deviations: WFL(Within Functional Limits)   Gait velocity interpretation: at or above normal speed for age/gender General Gait Details: Patient demonstrates mild drifting with head turns, no other loss of balance or gait deficits.   Stairs            Wheelchair Mobility    Modified Rankin (Stroke Patients Only)       Balance Overall balance assessment: Independent                                           Pertinent  Vitals/Pain Pain Assessment: No/denies pain (He reports he just received a fentanyl patch which relieves his pain)    Home Living Family/patient expects to be discharged to:: Private residence Living Arrangements: Alone   Type of Home: Mobile home Home Access: Stairs to enter   [employer] of Steps: Does not specify  Home Layout: One level Home Equipment: Cane - single point      Prior Function Level of Independence: Independent         Comments: Patient is a difficult historian, but it appears he still drives and is laregely independent.      Hand Dominance        Extremity/Trunk Assessment   Upper Extremity Assessment Upper Extremity Assessment: Overall WFL for tasks assessed    Lower Extremity Assessment Lower Extremity Assessment: Overall WFL for tasks assessed       Communication   Communication: HOH  Cognition Arousal/Alertness: Awake/alert Behavior During Therapy: WFL for tasks assessed/performed Overall Cognitive Status: Within Functional Limits for tasks assessed                      General Comments      Exercises     Assessment/Plan    PT Assessment Patient needs continued PT services  PT Problem List Cardiopulmonary status  limiting activity;Decreased strength          PT Treatment Interventions Gait training;Therapeutic exercise    PT Goals (Current goals can be found in the Care Plan section)  Acute Rehab PT Goals Patient Stated Goal: To return home  PT Goal Formulation: With patient Time For Goal Achievement: 06/21/16 Potential to Achieve Goals: Good    Frequency Min 2X/week   Barriers to discharge        Co-evaluation               End of Session Equipment Utilized During Treatment: Gait belt;Oxygen Activity Tolerance: Patient tolerated treatment well Patient left: in chair;with chair alarm set;with call bell/phone within reach Nurse Communication: Mobility status    Functional Assessment Tool  Used: Clinical judgement Functional Limitation: Mobility: Walking and moving around Mobility: Walking and Moving Around Current Status (T4656): At least 1 percent but less than 20 percent impaired, limited or restricted Mobility: Walking and Moving Around Goal Status [phone]): At least 1 percent but less than 20 percent impaired, limited or restricted    Time: 1352-1405 PT Time Calculation (min) (ACUTE ONLY): 13 min   Charges:   PT Evaluation $PT Eval Low Complexity: 1 Procedure     PT G Codes:   PT G-Codes **NOT FOR INPATIENT CLASS** Functional Assessment Tool Used: Clinical judgement Functional Limitation: Mobility: Walking and moving around Mobility: Walking and Moving Around Current Status (T7001): At least 1 percent but less than 20 percent impaired, limited or restricted Mobility: Walking and Moving Around Goal Status [phone]): At least 1 percent but less than 20 percent impaired, limited or restricted   Kerman Passey, PT, DPT    06/07/2016, 4:44 PM

## 2016-06-07 NOTE — Progress Notes (Signed)
Pharmacy Antibiotic Note  Jared Jefferson is a 71 y.o. male admitted on 06/05/2016 with pneumonia.  Pharmacy has been consulted for Cefepime dosing. Patient is currently ordered Cefepime 1g IV q24h and Azithromycin '500mg'$  q24h.  Plan: The dose of Cefepime will be adjusted to 1g IV q8h based on renal function.  Height: 5' 7.01" (170.2 cm) Weight: 149 lb 6.4 oz (67.8 kg) IBW/kg (Calculated) : 66.12  Temp (24hrs), Avg:98 F (36.7 C), Min:97.7 F (36.5 C), Max:98.6 F (37 C)   Recent Labs Lab 06/05/16 2224 06/06/16 0548 06/07/16 0413  WBC 6.8  --   --   CREATININE 1.15 1.15 0.92    Estimated Creatinine Clearance: 70.8 mL/min (by C-G formula based on SCr of 0.92 mg/dL).    Allergies  Allergen Reactions  . Iodine Anaphylaxis  . Iohexol Anaphylaxis  . Shellfish Allergy Anaphylaxis    Antimicrobials this admission: Cefepime 1/17 >>  Azithromycin 1/17 >>  Vancomycin 1/17 >> 1/17  Dose adjustments this admission: 1/18 Cefepime increased from q24h to q8h  Microbiology results: 1/16 MRSA PCR: neg  Thank you for allowing pharmacy to be a part of this patient's care.  Paulina Fusi, PharmD, BCPS 06/07/2016 11:40 AM

## 2016-06-07 NOTE — Discharge Instructions (Signed)
Resume diet and activity as before ° ° °

## 2016-06-07 NOTE — Progress Notes (Signed)
Palliative Medicine RN Note:

## 2016-06-07 NOTE — Progress Notes (Signed)
PHARMACIST - PHYSICIAN COMMUNICATION DR:   Darvin Neighbours CONCERNING: Antibiotic IV to Oral Route Change Policy  RECOMMENDATION: This patient is receiving azithromycin by the intravenous route.  Based on criteria approved by the Pharmacy and Therapeutics Committee, the antibiotic(s) is/are being converted to the equivalent oral dose form(s).   DESCRIPTION: These criteria include:  Patient being treated for a respiratory tract infection, urinary tract infection, cellulitis or clostridium difficile associated diarrhea if on metronidazole  The patient is not neutropenic and does not exhibit a GI malabsorption state  The patient is eating (either orally or via tube) and/or has been taking other orally administered medications for a least 24 hours  The patient is improving clinically and has a Tmax < 100.5  If you have questions about this conversion, please contact the Pharmacy Department  '[]'$   [phone] )  [facility] '[x]'$   [phone] )  [facility] '[]'$   [phone] )  [facility] '[]'$   [phone] )  [facility] '[]'$   [phone] )  [facility]   Paulina Fusi, PharmD, BCPS 06/07/2016 11:01 AM

## 2016-06-07 NOTE — Progress Notes (Signed)
Pt states he is scheduled to have a biopsy for Monday and he should not be taking any blood thinners. MD Sudimi notified per MD discontinue Asprin and Lovenox.

## 2016-06-07 NOTE — Progress Notes (Signed)
[facility] [place] NAME: Jared Jefferson    MR#:  [id number]  DATE OF BIRTH:  [birth year]  SUBJECTIVE:  CHIEF COMPLAINT:   Chief Complaint  Patient presents with  . Altered Mental Status   Confusion resolved  REVIEW OF SYSTEMS:    Review of Systems  Constitutional: Positive for malaise/fatigue and weight loss. Negative for chills and fever.  HENT: Negative for sore throat.   Eyes: Negative for blurred vision, double vision and pain.  Respiratory: Positive for cough. Negative for hemoptysis, shortness of breath and wheezing.   Cardiovascular: Negative for chest pain, palpitations, orthopnea and leg swelling.  Gastrointestinal: Negative for abdominal pain, constipation, diarrhea, heartburn, nausea and vomiting.  Genitourinary: Negative for dysuria and hematuria.  Musculoskeletal: Negative for back pain and joint pain.  Skin: Negative for rash.  Neurological: Positive for weakness. Negative for sensory change, speech change, focal weakness and headaches.  Endo/Heme/Allergies: Does not bruise/bleed easily.  Psychiatric/Behavioral: Negative for depression. The patient is not nervous/anxious.     DRUG ALLERGIES:   Allergies  Allergen Reactions  . Iodine Anaphylaxis  . Iohexol Anaphylaxis  . Shellfish Allergy Anaphylaxis    VITALS:  Blood pressure (!) 115/58, pulse 76, temperature 98.4 F (36.9 C), temperature source Oral, resp. rate 16, height 5' 7.01" (1.702 m), weight 67.8 kg (149 lb 6.4 oz), SpO2 94 %.  PHYSICAL EXAMINATION:   Physical Exam  GENERAL:  71 y.o.-year-old patient lying in the bed with no acute distress.  EYES: Pupils equal, round, reactive to light and accommodation. No scleral icterus. Extraocular muscles intact.  HEENT: Head atraumatic, normocephalic. Oropharynx and nasopharynx clear.  NECK:  Supple, no jugular venous distention. No thyroid enlargement, no tenderness.  LUNGS: Normal breath sounds bilaterally, no  wheezing, rales, rhonchi. No use of accessory muscles of respiration.  CARDIOVASCULAR: S1, S2 normal. No murmurs, rubs, or gallops.  ABDOMEN: Soft, nontender, nondistended. Bowel sounds present. No organomegaly or mass.  EXTREMITIES: No cyanosis, clubbing or edema b/l.    NEUROLOGIC: Cranial nerves II through XII are intact. No focal Motor or sensory deficits b/l.   PSYCHIATRIC: The patient is alert and oriented x 3.  SKIN: No obvious rash, lesion, or ulcer.   LABORATORY PANEL:   CBC  Recent Labs Lab 06/05/16 2224  WBC 6.8  HGB 11.5*  HCT 34.6*  PLT 140*   ------------------------------------------------------------------------------------------------------------------ Chemistries   Recent Labs Lab 06/05/16 2224  06/07/16 0413  NA 135  < > 139  K 4.6  < > 3.7  CL 98*  < > 105  CO2 28  < > 28  GLUCOSE 110*  < > 180*  BUN 31*  < > 24*  CREATININE 1.15  < > 0.92  CALCIUM 11.5*  < > 10.4*  AST 110*  --   --   ALT 39  --   --   ALKPHOS 144*  --   --   BILITOT 0.9  --   --   < > = values in this interval not displayed. ------------------------------------------------------------------------------------------------------------------  Cardiac Enzymes  Recent Labs Lab 06/06/16 1748  TROPONINI 0.04*   ------------------------------------------------------------------------------------------------------------------  RADIOLOGY:  Dg Chest 1 View  Result Date: 06/05/2016 CLINICAL DATA:  71 y/o  M; altered mental status. EXAM: CHEST 1 VIEW COMPARISON:  08/12/2015 PET-CT.  07/17/2015 chest radiograph. FINDINGS: Stable cardiac silhouette within normal limits. Status post median sternotomy. Aortic atherosclerosis with calcifications. No focal consolidation. There greater than right lung base reticulonodular opacities. No  pleural effusion or pneumothorax. Right upper lobe pulmonary nodule is better characterized on prior CT and not clearly evident on radiograph. No acute osseous  abnormality is identified. IMPRESSION: Left greater than right lung base reticulonodular opacities may represent atypical pneumonia or bronchitis. No focal consolidation. Pulmonary nodule on prior CT is visible radiographically. Aortic atherosclerosis. Electronically Signed   By: Kristine Garbe M.D.   On: 06/05/2016 22:53   Ct Head Wo Contrast  Result Date: 06/05/2016 CLINICAL DATA:  Altered mental status weakness EXAM: CT HEAD WITHOUT CONTRAST TECHNIQUE: Contiguous axial images were obtained from the base of the skull through the vertex without intravenous contrast. COMPARISON:  03/16/2005 FINDINGS: Brain: No acute territorial infarction or intracranial hemorrhage is visualized. Encephalomalacia in the bilateral inferior frontal lobes and the anterior right temporal lobe. No mass or midline shift. Mild atrophy. Scattered periventricular and subcortical white matter hypodensity consistent with small vessel disease Vascular: No hyperdense vessels.  Carotid artery calcifications. Skull: Mastoid air cells are clear.  No skull fracture. Sinuses/Orbits: Mucosal thickening within the ethmoid sinuses. No acute orbital abnormality Other: None IMPRESSION: 1. No CT evidence for acute intracranial abnormality 2. Old areas of encephalomalacia in the bilateral frontal and right temporal lobes. Electronically Signed   By: Donavan Foil M.D.   On: 06/05/2016 23:12     ASSESSMENT AND PLAN:   * Bibasilar pneumonia with acute encephalopathy On IV abx. Improving Afebrile. 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 Scheduled for a lung biopsy on Monday Stop plavix and aspirin Refusing lovenox here.  * DM SSI  * Hypercalcemia - resolved  All the records are reviewed and case discussed with Care Management/Social Worker Management plans discussed with the patient, family and they are in agreement.  CODE STATUS: FULL CODE  DVT Prophylaxis: SCDs  TOTAL TIME TAKING CARE  OF THIS PATIENT: 30 minutes.   POSSIBLE D/C IN 1-2 DAYS, DEPENDING ON CLINICAL CONDITION.  Hillary Bow R M.D on 06/07/2016 at 7:54 PM  Between 7am to 6pm - Pager - [phone]  After 6pm go to [email or web address] - password [email or web address] [facility]  Office  [phone]  CC: Primary care physician; [facility]  Note: This dictation was prepared with Dragon dictation along with smaller phrase technology. Any transcriptional errors that result from this process are unintentional.

## 2016-06-08 DIAGNOSIS — J189 Pneumonia, unspecified organism: Secondary | ICD-10-CM | POA: Diagnosis not present

## 2016-06-08 LAB — HEMOGLOBIN A1C
Hgb A1c MFr Bld: 6.5 % — ABNORMAL HIGH (ref 4.8–5.6)
Mean Plasma Glucose: 140 mg/dL

## 2016-06-08 LAB — GLUCOSE, CAPILLARY
Glucose-Capillary: 121 mg/dL — ABNORMAL HIGH (ref 65–99)
Glucose-Capillary: 179 mg/dL — ABNORMAL HIGH (ref 65–99)

## 2016-06-08 NOTE — Progress Notes (Signed)
DISCHARGE NOTE:  Pt given discharge instructions and prescriptions. Pts home medications returned to pt at the bedside. Pt wheeled to car by staff.

## 2016-06-08 NOTE — Progress Notes (Signed)
PTs BP 111/46, HR 77 Per MD Sudini ok to give morning dose of Metoprolol.

## 2016-06-08 NOTE — Care Management Note (Signed)
Case Management Note  Patient Details  Name: Jared Jefferson MRN: [id number] Date of Birth: November 26, 1945  Subjective/Objective:Patient will be discharging today to his home in [place].                  Action/Plan: [facility] notified of discharge. Patient updated and is greatful  Expected Discharge Date:  06/08/16               Expected Discharge Plan:     In-House Referral:  Clinical Social Work  Discharge planning Services  CM Consult  Post Acute Care Choice:  Durable Medical Equipment, Home Health Choice offered to:  Patient, Sibling  DME Arranged:    DME Agency:     HH Arranged:    [place] Agency:     Status of Service:  In process, will continue to follow  If discussed at Long Length of Stay Meetings, dates discussed:    Additional Comments:  Jolly Mango, RN 06/08/2016, 11:15 AM
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
## 2016-06-16 ENCOUNTER — Inpatient Hospital Stay: Payer: Medicare Other

## 2016-06-16 ENCOUNTER — Emergency Department: Payer: Medicare Other

## 2016-06-16 ENCOUNTER — Inpatient Hospital Stay
Admission: EM | Admit: 2016-06-16 | Discharge: 2016-06-21 | DRG: 871 | Disposition: E | Payer: Medicare Other | Attending: Pulmonary Disease | Admitting: Pulmonary Disease

## 2016-06-16 DIAGNOSIS — Z9981 Dependence on supplemental oxygen: Secondary | ICD-10-CM

## 2016-06-16 DIAGNOSIS — I2510 Atherosclerotic heart disease of native coronary artery without angina pectoris: Secondary | ICD-10-CM | POA: Diagnosis present

## 2016-06-16 DIAGNOSIS — A419 Sepsis, unspecified organism: Principal | ICD-10-CM | POA: Diagnosis present

## 2016-06-16 DIAGNOSIS — J440 Chronic obstructive pulmonary disease with acute lower respiratory infection: Secondary | ICD-10-CM | POA: Diagnosis present

## 2016-06-16 DIAGNOSIS — R0602 Shortness of breath: Secondary | ICD-10-CM | POA: Diagnosis present

## 2016-06-16 DIAGNOSIS — R570 Cardiogenic shock: Secondary | ICD-10-CM | POA: Diagnosis not present

## 2016-06-16 DIAGNOSIS — Z7952 Long term (current) use of systemic steroids: Secondary | ICD-10-CM

## 2016-06-16 DIAGNOSIS — R609 Edema, unspecified: Secondary | ICD-10-CM

## 2016-06-16 DIAGNOSIS — R0902 Hypoxemia: Secondary | ICD-10-CM

## 2016-06-16 DIAGNOSIS — Z9221 Personal history of antineoplastic chemotherapy: Secondary | ICD-10-CM

## 2016-06-16 DIAGNOSIS — G931 Anoxic brain damage, not elsewhere classified: Secondary | ICD-10-CM | POA: Diagnosis present

## 2016-06-16 DIAGNOSIS — Z66 Do not resuscitate: Secondary | ICD-10-CM | POA: Diagnosis not present

## 2016-06-16 DIAGNOSIS — Z7982 Long term (current) use of aspirin: Secondary | ICD-10-CM

## 2016-06-16 DIAGNOSIS — E785 Hyperlipidemia, unspecified: Secondary | ICD-10-CM | POA: Diagnosis present

## 2016-06-16 DIAGNOSIS — F419 Anxiety disorder, unspecified: Secondary | ICD-10-CM | POA: Diagnosis present

## 2016-06-16 DIAGNOSIS — D62 Acute posthemorrhagic anemia: Secondary | ICD-10-CM | POA: Diagnosis present

## 2016-06-16 DIAGNOSIS — I4901 Ventricular fibrillation: Secondary | ICD-10-CM | POA: Diagnosis not present

## 2016-06-16 DIAGNOSIS — C3411 Malignant neoplasm of upper lobe, right bronchus or lung: Secondary | ICD-10-CM | POA: Diagnosis present

## 2016-06-16 DIAGNOSIS — M549 Dorsalgia, unspecified: Secondary | ICD-10-CM | POA: Diagnosis present

## 2016-06-16 DIAGNOSIS — I110 Hypertensive heart disease with heart failure: Secondary | ICD-10-CM | POA: Diagnosis present

## 2016-06-16 DIAGNOSIS — J189 Pneumonia, unspecified organism: Secondary | ICD-10-CM | POA: Diagnosis present

## 2016-06-16 DIAGNOSIS — Z7984 Long term (current) use of oral hypoglycemic drugs: Secondary | ICD-10-CM

## 2016-06-16 DIAGNOSIS — K219 Gastro-esophageal reflux disease without esophagitis: Secondary | ICD-10-CM | POA: Diagnosis present

## 2016-06-16 DIAGNOSIS — K761 Chronic passive congestion of liver: Secondary | ICD-10-CM | POA: Diagnosis present

## 2016-06-16 DIAGNOSIS — Z7902 Long term (current) use of antithrombotics/antiplatelets: Secondary | ICD-10-CM

## 2016-06-16 DIAGNOSIS — I2699 Other pulmonary embolism without acute cor pulmonale: Secondary | ICD-10-CM | POA: Diagnosis present

## 2016-06-16 DIAGNOSIS — J9801 Acute bronchospasm: Secondary | ICD-10-CM | POA: Diagnosis present

## 2016-06-16 DIAGNOSIS — J441 Chronic obstructive pulmonary disease with (acute) exacerbation: Secondary | ICD-10-CM | POA: Diagnosis present

## 2016-06-16 DIAGNOSIS — I248 Other forms of acute ischemic heart disease: Secondary | ICD-10-CM | POA: Diagnosis present

## 2016-06-16 DIAGNOSIS — Y95 Nosocomial condition: Secondary | ICD-10-CM | POA: Diagnosis present

## 2016-06-16 DIAGNOSIS — J9621 Acute and chronic respiratory failure with hypoxia: Secondary | ICD-10-CM | POA: Diagnosis present

## 2016-06-16 DIAGNOSIS — N179 Acute kidney failure, unspecified: Secondary | ICD-10-CM | POA: Diagnosis present

## 2016-06-16 DIAGNOSIS — I4891 Unspecified atrial fibrillation: Secondary | ICD-10-CM | POA: Diagnosis present

## 2016-06-16 DIAGNOSIS — Z973 Presence of spectacles and contact lenses: Secondary | ICD-10-CM

## 2016-06-16 DIAGNOSIS — Z801 Family history of malignant neoplasm of trachea, bronchus and lung: Secondary | ICD-10-CM

## 2016-06-16 DIAGNOSIS — J9690 Respiratory failure, unspecified, unspecified whether with hypoxia or hypercapnia: Secondary | ICD-10-CM

## 2016-06-16 DIAGNOSIS — Z01818 Encounter for other preprocedural examination: Secondary | ICD-10-CM

## 2016-06-16 DIAGNOSIS — Z9861 Coronary angioplasty status: Secondary | ICD-10-CM

## 2016-06-16 DIAGNOSIS — Z888 Allergy status to other drugs, medicaments and biological substances status: Secondary | ICD-10-CM

## 2016-06-16 DIAGNOSIS — E875 Hyperkalemia: Secondary | ICD-10-CM | POA: Diagnosis present

## 2016-06-16 DIAGNOSIS — Z951 Presence of aortocoronary bypass graft: Secondary | ICD-10-CM

## 2016-06-16 DIAGNOSIS — I82411 Acute embolism and thrombosis of right femoral vein: Secondary | ICD-10-CM | POA: Diagnosis present

## 2016-06-16 DIAGNOSIS — E1151 Type 2 diabetes mellitus with diabetic peripheral angiopathy without gangrene: Secondary | ICD-10-CM | POA: Diagnosis present

## 2016-06-16 DIAGNOSIS — R778 Other specified abnormalities of plasma proteins: Secondary | ICD-10-CM

## 2016-06-16 DIAGNOSIS — R7989 Other specified abnormal findings of blood chemistry: Secondary | ICD-10-CM

## 2016-06-16 DIAGNOSIS — I469 Cardiac arrest, cause unspecified: Secondary | ICD-10-CM | POA: Diagnosis not present

## 2016-06-16 DIAGNOSIS — Z7951 Long term (current) use of inhaled steroids: Secondary | ICD-10-CM

## 2016-06-16 DIAGNOSIS — J9601 Acute respiratory failure with hypoxia: Secondary | ICD-10-CM | POA: Diagnosis not present

## 2016-06-16 DIAGNOSIS — Z7901 Long term (current) use of anticoagulants: Secondary | ICD-10-CM

## 2016-06-16 DIAGNOSIS — Z4659 Encounter for fitting and adjustment of other gastrointestinal appliance and device: Secondary | ICD-10-CM

## 2016-06-16 DIAGNOSIS — I5032 Chronic diastolic (congestive) heart failure: Secondary | ICD-10-CM | POA: Diagnosis present

## 2016-06-16 DIAGNOSIS — K7200 Acute and subacute hepatic failure without coma: Secondary | ICD-10-CM | POA: Diagnosis not present

## 2016-06-16 DIAGNOSIS — C787 Secondary malignant neoplasm of liver and intrahepatic bile duct: Secondary | ICD-10-CM | POA: Diagnosis present

## 2016-06-16 DIAGNOSIS — Z91013 Allergy to seafood: Secondary | ICD-10-CM

## 2016-06-16 DIAGNOSIS — G893 Neoplasm related pain (acute) (chronic): Secondary | ICD-10-CM | POA: Diagnosis present

## 2016-06-16 DIAGNOSIS — R079 Chest pain, unspecified: Secondary | ICD-10-CM | POA: Diagnosis not present

## 2016-06-16 DIAGNOSIS — Z87891 Personal history of nicotine dependence: Secondary | ICD-10-CM

## 2016-06-16 DIAGNOSIS — D649 Anemia, unspecified: Secondary | ICD-10-CM | POA: Diagnosis present

## 2016-06-16 DIAGNOSIS — G4730 Sleep apnea, unspecified: Secondary | ICD-10-CM | POA: Diagnosis present

## 2016-06-16 DIAGNOSIS — Z79899 Other long term (current) drug therapy: Secondary | ICD-10-CM

## 2016-06-16 DIAGNOSIS — Z8249 Family history of ischemic heart disease and other diseases of the circulatory system: Secondary | ICD-10-CM

## 2016-06-16 DIAGNOSIS — I252 Old myocardial infarction: Secondary | ICD-10-CM

## 2016-06-16 LAB — STREP PNEUMONIAE URINARY ANTIGEN: Strep Pneumo Urinary Antigen: NEGATIVE

## 2016-06-16 LAB — CBC WITH DIFFERENTIAL/PLATELET
BASOS ABS: 0.1 10*3/uL (ref 0–0.1)
Basophils Relative: 0 %
EOS ABS: 0 10*3/uL (ref 0–0.7)
EOS PCT: 0 %
HCT: 40 % (ref 40.0–52.0)
Hemoglobin: 13.1 g/dL (ref 13.0–18.0)
LYMPHS PCT: 11 %
Lymphs Abs: 1.5 10*3/uL (ref 1.0–3.6)
MCH: 28.5 pg (ref 26.0–34.0)
MCHC: 32.6 g/dL (ref 32.0–36.0)
MCV: 87.5 fL (ref 80.0–100.0)
MONO ABS: 1.1 10*3/uL — AB (ref 0.2–1.0)
Monocytes Relative: 8 %
Neutro Abs: 11.8 10*3/uL — ABNORMAL HIGH (ref 1.4–6.5)
Neutrophils Relative %: 81 %
PLATELETS: 90 10*3/uL — AB (ref 150–440)
RBC: 4.57 MIL/uL (ref 4.40–5.90)
RDW: 16.4 % — AB (ref 11.5–14.5)
WBC: 14.5 10*3/uL — ABNORMAL HIGH (ref 3.8–10.6)

## 2016-06-16 LAB — COMPREHENSIVE METABOLIC PANEL
ALBUMIN: 3.3 g/dL — AB (ref 3.5–5.0)
ALK PHOS: 209 U/L — AB (ref 38–126)
ALT: 250 U/L — ABNORMAL HIGH (ref 17–63)
AST: 289 U/L — AB (ref 15–41)
Anion gap: 12 (ref 5–15)
BILIRUBIN TOTAL: 1.3 mg/dL — AB (ref 0.3–1.2)
BUN: 25 mg/dL — AB (ref 6–20)
CALCIUM: 10.3 mg/dL (ref 8.9–10.3)
CO2: 24 mmol/L (ref 22–32)
CREATININE: 0.98 mg/dL (ref 0.61–1.24)
Chloride: 100 mmol/L — ABNORMAL LOW (ref 101–111)
GFR calc Af Amer: >60 mL/min (ref >60)
GLUCOSE: 183 mg/dL — AB (ref 65–99)
Potassium: 4.5 mmol/L (ref 3.5–5.1)
Sodium: 136 mmol/L (ref 135–145)
TOTAL PROTEIN: 7.1 g/dL (ref 6.5–8.1)

## 2016-06-16 LAB — TROPONIN I
TROPONIN I: 1.15 ng/mL — AB (ref <0.03)
Troponin I: 0.71 ng/mL (ref <0.03)
Troponin I: 0.37 ng/mL (ref <0.03)

## 2016-06-16 LAB — URINALYSIS, COMPLETE (UACMP) WITH MICROSCOPIC
Bilirubin Urine: NEGATIVE
Glucose, UA: 50 mg/dL — AB
KETONES UR: 20 mg/dL — AB
Leukocytes, UA: NEGATIVE
Nitrite: NEGATIVE
PH: 5 (ref 5.0–8.0)
PROTEIN: 100 mg/dL — AB
Specific Gravity, Urine: 1.026 (ref 1.005–1.030)

## 2016-06-16 LAB — PROTIME-INR
INR: 1.54
PROTHROMBIN TIME: 18.6 s — AB (ref 11.4–15.2)

## 2016-06-16 LAB — LIPASE, BLOOD: LIPASE: 14 U/L (ref 11–51)

## 2016-06-16 LAB — INFLUENZA PANEL BY PCR (TYPE A & B)
INFLBPCR: NEGATIVE
Influenza A By PCR: NEGATIVE

## 2016-06-16 LAB — HEPARIN LEVEL (UNFRACTIONATED)
HEPARIN UNFRACTIONATED: 0.3 [IU]/mL (ref 0.30–0.70)
Heparin Unfractionated: <0.1 IU/mL — ABNORMAL LOW (ref 0.30–0.70)

## 2016-06-16 LAB — BRAIN NATRIURETIC PEPTIDE: B Natriuretic Peptide: 246 pg/mL — ABNORMAL HIGH (ref 0.0–100.0)

## 2016-06-16 LAB — TSH: TSH: 0.479 u[IU]/mL (ref 0.350–4.500)

## 2016-06-16 LAB — GLUCOSE, CAPILLARY
Glucose-Capillary: 200 mg/dL — ABNORMAL HIGH (ref 65–99)
Glucose-Capillary: 235 mg/dL — ABNORMAL HIGH (ref 65–99)
Glucose-Capillary: 199 mg/dL — ABNORMAL HIGH (ref 65–99)
Glucose-Capillary: 185 mg/dL — ABNORMAL HIGH (ref 65–99)

## 2016-06-16 LAB — APTT: aPTT: 40 seconds — ABNORMAL HIGH (ref 24–36)

## 2016-06-16 LAB — LACTIC ACID, PLASMA
Lactic Acid, Venous: 2.3 mmol/L (ref 0.5–1.9)
Lactic Acid, Venous: 3.5 mmol/L (ref 0.5–1.9)

## 2016-06-16 MED ORDER — ONDANSETRON HCL 4 MG/2ML IJ SOLN
4.0000 mg | Freq: Four times a day (QID) | INTRAMUSCULAR | Status: DC | PRN
Start: 2016-06-16 — End: 2016-06-19

## 2016-06-16 MED ORDER — ASPIRIN EC 81 MG PO TBEC
81.0000 mg | DELAYED_RELEASE_TABLET | Freq: Every day | ORAL | Status: DC
  Administered 2016-06-16 – 2016-06-17 (×2): 81 mg via ORAL
  Filled 2016-06-16 (×2): qty 1

## 2016-06-16 MED ORDER — SODIUM CHLORIDE 0.9 % IV BOLUS (SEPSIS)
500.0000 mL | Freq: Once | INTRAVENOUS | Status: AC
  Administered 2016-06-16: 500 mL via INTRAVENOUS

## 2016-06-16 MED ORDER — DOCUSATE SODIUM 100 MG PO CAPS
100.0000 mg | ORAL_CAPSULE | Freq: Two times a day (BID) | ORAL | Status: DC
  Administered 2016-06-16 – 2016-06-17 (×4): 100 mg via ORAL
  Filled 2016-06-16 (×4): qty 1

## 2016-06-16 MED ORDER — ACETAMINOPHEN 650 MG RE SUPP: 650.0000 mg | Freq: Four times a day (QID) | RECTAL | Status: DC | PRN

## 2016-06-16 MED ORDER — ALBUTEROL SULFATE (2.5 MG/3ML) 0.083% IN NEBU
2.5000 mg | INHALATION_SOLUTION | RESPIRATORY_TRACT | Status: DC
  Administered 2016-06-16: 07:00:00 2.5 mg via RESPIRATORY_TRACT
  Filled 2016-06-16: qty 3

## 2016-06-16 MED ORDER — TECHNETIUM TC 99M DIETHYLENETRIAME-PENTAACETIC ACID
30.0000 | Freq: Once | INTRAVENOUS | Status: AC | PRN
  Administered 2016-06-16: 30.46 via INTRAVENOUS

## 2016-06-16 MED ORDER — CEFEPIME-DEXTROSE 1 GM/50ML IV SOLR
1.0000 g | Freq: Three times a day (TID) | INTRAVENOUS | Status: DC
  Administered 2016-06-16 – 2016-06-18 (×6): 1 g via INTRAVENOUS
  Filled 2016-06-16 (×7): qty 50

## 2016-06-16 MED ORDER — SODIUM CHLORIDE 0.9 % IV SOLN
INTRAVENOUS | Status: AC
Start: 2016-06-16 — End: 2016-06-16
  Administered 2016-06-16: 05:00:00 via INTRAVENOUS

## 2016-06-16 MED ORDER — CARISOPRODOL 350 MG PO TABS
350.0000 mg | ORAL_TABLET | Freq: Three times a day (TID) | ORAL | Status: DC | PRN
  Administered 2016-06-16: 350 mg via ORAL
  Filled 2016-06-16: qty 1

## 2016-06-16 MED ORDER — CLOPIDOGREL BISULFATE 75 MG PO TABS
75.0000 mg | ORAL_TABLET | Freq: Every day | ORAL | Status: DC
  Administered 2016-06-16 – 2016-06-17 (×2): 75 mg via ORAL
  Filled 2016-06-16 (×2): qty 1

## 2016-06-16 MED ORDER — FENTANYL 50 MCG/HR TD PT72: 50.0000 ug | MEDICATED_PATCH | TRANSDERMAL | Status: DC

## 2016-06-16 MED ORDER — HEPARIN BOLUS VIA INFUSION
2000.0000 [IU] | Freq: Once | INTRAVENOUS | Status: AC
Start: 2016-06-16 — End: 2016-06-16
  Administered 2016-06-16: 16:00:00 2000 [IU] via INTRAVENOUS
  Filled 2016-06-16: qty 2000

## 2016-06-16 MED ORDER — IPRATROPIUM-ALBUTEROL 0.5-2.5 (3) MG/3ML IN SOLN
3.0000 mL | Freq: Once | RESPIRATORY_TRACT | Status: AC
  Administered 2016-06-16: 3 mL via RESPIRATORY_TRACT

## 2016-06-16 MED ORDER — MOMETASONE FURO-FORMOTEROL FUM 100-5 MCG/ACT IN AERO
2.0000 | INHALATION_SPRAY | Freq: Two times a day (BID) | RESPIRATORY_TRACT | Status: DC
  Administered 2016-06-16 – 2016-06-17 (×4): 2 via RESPIRATORY_TRACT
  Filled 2016-06-16: qty 8.8

## 2016-06-16 MED ORDER — SODIUM CHLORIDE 0.9% FLUSH
3.0000 mL | Freq: Two times a day (BID) | INTRAVENOUS | Status: DC
  Administered 2016-06-16 – 2016-06-18 (×5): 3 mL via INTRAVENOUS

## 2016-06-16 MED ORDER — ALBUTEROL SULFATE (2.5 MG/3ML) 0.083% IN NEBU
2.5000 mg | INHALATION_SOLUTION | RESPIRATORY_TRACT | Status: DC | PRN
Start: 2016-06-16 — End: 2016-06-18

## 2016-06-16 MED ORDER — SODIUM CHLORIDE 0.9 % IV BOLUS (SEPSIS)
250.0000 mL | Freq: Once | INTRAVENOUS | Status: AC
  Administered 2016-06-16: 250 mL via INTRAVENOUS

## 2016-06-16 MED ORDER — NITROGLYCERIN 0.4 MG SL SUBL: 0.4000 mg | SUBLINGUAL_TABLET | SUBLINGUAL | Status: DC | PRN

## 2016-06-16 MED ORDER — INSULIN ASPART 100 UNIT/ML ~~LOC~~ SOLN
0.0000 [IU] | Freq: Every day | SUBCUTANEOUS | Status: DC
  Administered 2016-06-17: 23:00:00 2 [IU] via SUBCUTANEOUS
  Filled 2016-06-16: qty 2

## 2016-06-16 MED ORDER — HEPARIN (PORCINE) IN NACL 100-0.45 UNIT/ML-% IJ SOLN
800.0000 [IU]/h | INTRAMUSCULAR | Status: DC
  Administered 2016-06-16: 800 [IU]/h via INTRAVENOUS
  Filled 2016-06-16 (×2): qty 250

## 2016-06-16 MED ORDER — VANCOMYCIN HCL IN DEXTROSE 1-5 GM/200ML-% IV SOLN
1000.0000 mg | Freq: Two times a day (BID) | INTRAVENOUS | Status: DC
  Administered 2016-06-16 – 2016-06-17 (×3): 1000 mg via INTRAVENOUS
  Filled 2016-06-16 (×4): qty 200

## 2016-06-16 MED ORDER — METOPROLOL SUCCINATE ER 100 MG PO TB24
100.0000 mg | ORAL_TABLET | Freq: Every day | ORAL | Status: DC
  Administered 2016-06-16 – 2016-06-17 (×2): 100 mg via ORAL
  Filled 2016-06-16 (×2): qty 1

## 2016-06-16 MED ORDER — TIOTROPIUM BROMIDE MONOHYDRATE 18 MCG IN CAPS
18.0000 ug | ORAL_CAPSULE | Freq: Every day | RESPIRATORY_TRACT | Status: DC
  Administered 2016-06-16 – 2016-06-17 (×2): 18 ug via RESPIRATORY_TRACT
  Filled 2016-06-16: qty 5

## 2016-06-16 MED ORDER — VANCOMYCIN HCL IN DEXTROSE 1-5 GM/200ML-% IV SOLN
1000.0000 mg | Freq: Once | INTRAVENOUS | Status: AC
  Administered 2016-06-16: 1000 mg via INTRAVENOUS
  Filled 2016-06-16: qty 200

## 2016-06-16 MED ORDER — ALBUTEROL SULFATE (2.5 MG/3ML) 0.083% IN NEBU
2.5000 mg | INHALATION_SOLUTION | Freq: Four times a day (QID) | RESPIRATORY_TRACT | Status: DC
  Administered 2016-06-16: 2.5 mg via RESPIRATORY_TRACT
  Filled 2016-06-16 (×3): qty 3

## 2016-06-16 MED ORDER — HEPARIN (PORCINE) IN NACL 100-0.45 UNIT/ML-% IJ SOLN
1100.0000 [IU]/h | INTRAMUSCULAR | Status: DC
  Administered 2016-06-16: 1050 [IU]/h via INTRAVENOUS
  Administered 2016-06-17: 1100 [IU]/h via INTRAVENOUS
  Filled 2016-06-16 (×2): qty 250

## 2016-06-16 MED ORDER — MORPHINE SULFATE (PF) 2 MG/ML IV SOLN
2.0000 mg | INTRAVENOUS | Status: AC
  Administered 2016-06-16: 2 mg via INTRAVENOUS
  Filled 2016-06-16: qty 1

## 2016-06-16 MED ORDER — PIPERACILLIN-TAZOBACTAM 3.375 G IVPB 30 MIN
3.3750 g | Freq: Once | INTRAVENOUS | Status: AC
  Administered 2016-06-16: 3.375 g via INTRAVENOUS
  Filled 2016-06-16: qty 50

## 2016-06-16 MED ORDER — ORAL CARE MOUTH RINSE
15.0000 mL | Freq: Two times a day (BID) | OROMUCOSAL | Status: DC
  Administered 2016-06-16 – 2016-06-17 (×4): 15 mL via OROMUCOSAL

## 2016-06-16 MED ORDER — HEPARIN BOLUS VIA INFUSION
4000.0000 [IU] | Freq: Once | INTRAVENOUS | Status: AC
  Administered 2016-06-16: 4000 [IU] via INTRAVENOUS
  Filled 2016-06-16: qty 4000

## 2016-06-16 MED ORDER — METHYLPREDNISOLONE SODIUM SUCC 40 MG IJ SOLR
40.0000 mg | Freq: Three times a day (TID) | INTRAMUSCULAR | Status: DC
  Administered 2016-06-16 – 2016-06-18 (×6): 40 mg via INTRAVENOUS
  Filled 2016-06-16 (×6): qty 1

## 2016-06-16 MED ORDER — OXYCODONE HCL 5 MG PO TABS
5.0000 mg | ORAL_TABLET | Freq: Four times a day (QID) | ORAL | Status: DC | PRN
  Administered 2016-06-16 – 2016-06-17 (×2): 5 mg via ORAL
  Filled 2016-06-16 (×2): qty 1

## 2016-06-16 MED ORDER — ASPIRIN 81 MG PO CHEW
324.0000 mg | CHEWABLE_TABLET | Freq: Once | ORAL | Status: AC
  Administered 2016-06-16: 324 mg via ORAL
  Filled 2016-06-16: qty 4

## 2016-06-16 MED ORDER — ONDANSETRON HCL 4 MG PO TABS: 4.0000 mg | ORAL_TABLET | Freq: Four times a day (QID) | ORAL | Status: DC | PRN

## 2016-06-16 MED ORDER — ACETAMINOPHEN 325 MG PO TABS
650.0000 mg | ORAL_TABLET | Freq: Four times a day (QID) | ORAL | Status: DC | PRN
  Filled 2016-06-16: qty 2

## 2016-06-16 MED ORDER — IPRATROPIUM-ALBUTEROL 0.5-2.5 (3) MG/3ML IN SOLN
RESPIRATORY_TRACT | Status: AC
  Administered 2016-06-16: 3 mL via RESPIRATORY_TRACT
  Filled 2016-06-16: qty 3

## 2016-06-16 MED ORDER — INSULIN ASPART 100 UNIT/ML ~~LOC~~ SOLN
0.0000 [IU] | Freq: Three times a day (TID) | SUBCUTANEOUS | Status: DC
  Administered 2016-06-16 (×2): 2 [IU] via SUBCUTANEOUS
  Administered 2016-06-17: 3 [IU] via SUBCUTANEOUS
  Administered 2016-06-17: 13:00:00 2 [IU] via SUBCUTANEOUS
  Administered 2016-06-17: 3 [IU] via SUBCUTANEOUS
  Filled 2016-06-16: qty 3
  Filled 2016-06-16 (×2): qty 2
  Filled 2016-06-16: qty 3
  Filled 2016-06-16: qty 2
  Filled 2016-06-16: qty 1

## 2016-06-16 MED ORDER — TECHNETIUM TO 99M ALBUMIN AGGREGATED
4.0000 | Freq: Once | INTRAVENOUS | Status: AC | PRN
  Administered 2016-06-16: 4.152 via INTRAVENOUS

## 2016-06-16 MED ORDER — PREDNISONE 50 MG PO TABS
50.0000 mg | ORAL_TABLET | Freq: Every day | ORAL | Status: DC
  Administered 2016-06-16: 50 mg via ORAL
  Filled 2016-06-16: qty 1

## 2016-06-16 MED ORDER — METHYLPREDNISOLONE SODIUM SUCC 125 MG IJ SOLR
125.0000 mg | Freq: Once | INTRAMUSCULAR | Status: AC
  Administered 2016-06-16: 125 mg via INTRAVENOUS

## 2016-06-16 MED ORDER — SODIUM CHLORIDE 0.9 % IV BOLUS (SEPSIS)
1000.0000 mL | Freq: Once | INTRAVENOUS | Status: AC
  Administered 2016-06-16: 1000 mL via INTRAVENOUS

## 2016-06-16 MED ORDER — CLONAZEPAM 1 MG PO TABS
1.0000 mg | ORAL_TABLET | Freq: Every day | ORAL | Status: DC
  Administered 2016-06-16 – 2016-06-17 (×2): 1 mg via ORAL
  Filled 2016-06-16 (×2): qty 1

## 2016-06-16 MED ORDER — DEXTROSE 5 % IV SOLN
1.0000 g | Freq: Three times a day (TID) | INTRAVENOUS | Status: DC
  Administered 2016-06-16: 1 g via INTRAVENOUS
  Filled 2016-06-16 (×3): qty 1

## 2016-06-16 MED ORDER — METHYLPREDNISOLONE SODIUM SUCC 125 MG IJ SOLR
INTRAMUSCULAR | Status: AC
  Administered 2016-06-16: 125 mg via INTRAVENOUS
  Filled 2016-06-16: qty 2

## 2016-06-16 NOTE — Progress Notes (Signed)
MD order received iin [facility] for a PICC line; telephone call to [facility] [phone], intake information given to callback with name of provider and estimated time of arrival

## 2016-06-16 NOTE — Progress Notes (Signed)
Pharmacy Antibiotic Note  Jared Jefferson is a 71 y.o. male admitted on 05/22/2016 with pneumonia.  Pharmacy has been consulted for vancomycin dosing.  Plan: DW 71kg  Vd 50L kei 0.063 hr-1  T1/2 11 hours Vancomycin 1 gram q 12 hours ordered with stacked dosing. Level before 5th dose. Goal trough 15-20.   Height: '5\' 10"'$  (177.8 cm) Weight: 155 lb 12.8 oz (70.7 kg) IBW/kg (Calculated) : 73  Temp (24hrs), Avg:98 F (36.7 C), Min:97.9 F (36.6 C), Max:98.1 F (36.7 C)   Recent Labs Lab 06/20/2016 0025  WBC 14.5*  CREATININE 0.98  LATICACIDVEN 3.5*    Estimated Creatinine Clearance: 71.1 mL/min (by C-G formula based on SCr of 0.98 mg/dL).    Allergies  Allergen Reactions  . Iodine Anaphylaxis  . Iohexol Anaphylaxis  . Shellfish Allergy Anaphylaxis    Antimicrobials this admission: vancomycin  1/27 >>  cefepime 1/27 >>   Dose adjustments this admission:   Microbiology results: 1/27 BCx: pending 1/27 UCx: pending   1/17 MRSA PCR: (-)     1/27 UA: LE(-) NO2(-) WBC 6-30   Thank you for allowing pharmacy to be a part of this patient's care.  Astryd Pearcy S 05/31/2016 4:19 AM

## 2016-06-16 NOTE — ED Triage Notes (Signed)
Pt arrives to ER via [facility] from home c/o weakness and fatigue worsening today. Pt dx with pneumonia X 2 weeks ago per his report. Pt reporting pain with urination. Pt arrives on 4L [place], pt uses 2L [place] chronically.

## 2016-06-16 NOTE — ED Notes (Signed)
Called to give report, nurse is away from desk, will return call shortly.
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
## 2016-06-16 NOTE — ED Notes (Addendum)
Attempt for IV access by this RN unsuccessful, by Catalina [place], RN unsuccessful X 1 and by [facility] unsuccessfully. Ena Dawley, RN at bedside with ultrasound machine to look for IV access.
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
## 2016-06-16 NOTE — ED Notes (Signed)
Report to Grayland Ormond, RN and [place], Therapist, sports
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
## 2016-06-16 NOTE — Progress Notes (Signed)
Telephone call from [facility]), advised that Deneen Harts RN would be here around 1530 today
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
## 2016-06-16 NOTE — Progress Notes (Signed)
Received OR indicating that the Pt in Rm 127 needed prayer. CH met with Pt, who was with the Nurse in the Rm. Pt reviewed his life, his struggles with poor health, for a while Dc's misdiagnosis his illness, but fortunately, Dc's at [facility] diagnose his illness and are providing treatment, which he is pleased with. Stated that he has two persons praying for him to cope with, and overcome lung cancer. Pt's request for prayer was an attempt to reconnect with spiritual source in time of crisis, which the [facility] understood, and was able to offer prayers for healing.      06/01/2016 0800  Clinical Encounter Type  Visited With Patient  Visit Type Initial;Spiritual support  Referral From Nurse  Consult/Referral To Chaplain  Spiritual Encounters  Spiritual Needs Prayer  Stress Factors  Patient Stress Factors Major life changes  Family Stress Factors Health changes
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
## 2016-06-16 NOTE — ED Notes (Signed)
Received a call indicating patient needs 2nd lactic acid.  Pt. Already moved to floor, called Lab to inform them pt. Needs 2nd lactic and APTT drawn.  Lab stated they would go to floor to get.
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
## 2016-06-17 ENCOUNTER — Inpatient Hospital Stay (HOSPITAL_COMMUNITY)
Admit: 2016-06-17 | Discharge: 2016-06-17 | Disposition: A | Discharge status: Home / Self Care | Payer: Medicare Other | Attending: Internal Medicine | Admitting: Internal Medicine

## 2016-06-17 ENCOUNTER — Inpatient Hospital Stay: Payer: Medicare Other

## 2016-06-17 DIAGNOSIS — R079 Chest pain, unspecified: Secondary | ICD-10-CM

## 2016-06-17 LAB — GLUCOSE, CAPILLARY
Glucose-Capillary: 215 mg/dL — ABNORMAL HIGH (ref 65–99)
Glucose-Capillary: 182 mg/dL — ABNORMAL HIGH (ref 65–99)
Glucose-Capillary: 204 mg/dL — ABNORMAL HIGH (ref 65–99)
Glucose-Capillary: 214 mg/dL — ABNORMAL HIGH (ref 65–99)

## 2016-06-17 LAB — BASIC METABOLIC PANEL
Anion gap: 5 (ref 5–15)
BUN: 25 mg/dL — ABNORMAL HIGH (ref 6–20)
CO2: 26 mmol/L (ref 22–32)
Calcium: 9.8 mg/dL (ref 8.9–10.3)
Chloride: 107 mmol/L (ref 101–111)
Creatinine, Ser: 0.75 mg/dL (ref 0.61–1.24)
GFR calc Af Amer: >60 mL/min (ref >60)
GFR calc non Af Amer: >60 mL/min (ref >60)
Glucose, Bld: 206 mg/dL — ABNORMAL HIGH (ref 65–99)
Potassium: 4.3 mmol/L (ref 3.5–5.1)
Sodium: 138 mmol/L (ref 135–145)

## 2016-06-17 LAB — HEMOGLOBIN: HEMOGLOBIN: 9.5 g/dL — AB (ref 13.0–18.0)

## 2016-06-17 LAB — CBC
HCT: 28.2 % — ABNORMAL LOW (ref 40.0–52.0)
Hemoglobin: 9.3 g/dL — ABNORMAL LOW (ref 13.0–18.0)
MCH: 28.8 pg (ref 26.0–34.0)
MCHC: 33 g/dL (ref 32.0–36.0)
MCV: 87.2 fL (ref 80.0–100.0)
Platelets: 93 10*3/uL — ABNORMAL LOW (ref 150–440)
RBC: 3.24 MIL/uL — ABNORMAL LOW (ref 4.40–5.90)
RDW: 15.8 % — ABNORMAL HIGH (ref 11.5–14.5)
WBC: 18.1 10*3/uL — ABNORMAL HIGH (ref 3.8–10.6)

## 2016-06-17 LAB — HIV ANTIBODY (ROUTINE TESTING W REFLEX): HIV Screen 4th Generation wRfx: NONREACTIVE

## 2016-06-17 LAB — ECHOCARDIOGRAM COMPLETE
HEIGHTINCHES: 70 in
Weight: 2627.2 oz

## 2016-06-17 LAB — HEPARIN LEVEL (UNFRACTIONATED): Heparin Unfractionated: 0.3 IU/mL (ref 0.30–0.70)

## 2016-06-17 MED ORDER — APIXABAN 5 MG PO TABS
10.0000 mg | ORAL_TABLET | Freq: Two times a day (BID) | ORAL | Status: DC
  Administered 2016-06-17 (×2): 10 mg via ORAL
  Filled 2016-06-17 (×2): qty 2

## 2016-06-17 MED ORDER — ALBUTEROL SULFATE (2.5 MG/3ML) 0.083% IN NEBU
2.5000 mg | INHALATION_SOLUTION | Freq: Three times a day (TID) | RESPIRATORY_TRACT | Status: DC
  Administered 2016-06-17 (×2): 2.5 mg via RESPIRATORY_TRACT
  Filled 2016-06-17: qty 3

## 2016-06-17 MED ORDER — MORPHINE SULFATE (PF) 2 MG/ML IV SOLN
2.0000 mg | Freq: Four times a day (QID) | INTRAVENOUS | Status: DC | PRN
  Administered 2016-06-17: 2 mg via INTRAVENOUS
  Filled 2016-06-17: qty 1

## 2016-06-17 MED ORDER — APIXABAN 5 MG PO TABS: 5.0000 mg | ORAL_TABLET | Freq: Two times a day (BID) | ORAL | Status: DC

## 2016-06-17 MED ORDER — OXYCODONE HCL 5 MG PO TABS
5.0000 mg | ORAL_TABLET | Freq: Four times a day (QID) | ORAL | Status: DC | PRN
  Administered 2016-06-17: 5 mg via ORAL
  Administered 2016-06-17: 10 mg via ORAL
  Filled 2016-06-17: qty 1
  Filled 2016-06-17: qty 2
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
## 2016-06-17 NOTE — Progress Notes (Signed)
Initial Nutrition Assessment  DOCUMENTATION CODES:   Non-severe (moderate) malnutrition in context of chronic illness  INTERVENTION:  1. Magic cup TID with meals, each supplement provides 290 kcal and 9 grams of protein  NUTRITION DIAGNOSIS:   Malnutrition related to chronic illness as evidenced by severe depletion of body fat, moderate depletions of muscle mass.  GOAL:   Patient will meet greater than or equal to 90% of their needs  MONITOR:   PO intake, I & O's, Vent status, Supplement acceptance, Labs, Weight trends  REASON FOR ASSESSMENT:   Malnutrition Screening Tool    ASSESSMENT:   The patient with past medical history significant for non-small cell lung cancer metastatic to his liver as well as recent hospitalization for pneumonia presents to the emergency department complaining of shortness of breath  Jared Jefferson was recently admitted - returns with  Seemingly better PO intake and weight gain of 15# Reports he is eating 2-3 meals per day, but family reports he is still eating poorly. He reports fluctuations in appetite similar to previous admission. Did not elaborate on amount of food eaten.  Consumed 100% of breakfast this morning - scrambled eggs, blueberry muffin, orange juice, coffee. During my visit - he was reporting recent poor findings via imaging - had not eaten any lunch. He was not fully engaged during visit.  Nutrition-Focused physical exam completed. Findings are severe fat depletion, moderate muscle depletion, and no edema.   Labs and medications reviewed: CBGs 182-215, TBili 1.3 Colace, Solumedrol  Diet Order:  Diet heart healthy/carb modified Room service appropriate? Yes; Fluid consistency: Thin  Skin:  Reviewed, no issues  Last BM:  06/14/2016  Height:   Ht Readings from Last 1 Encounters:  06/15/2016 '5\' 10"'$  (1.778 m)    Weight:   Wt Readings from Last 1 Encounters:  06/17/16 164 lb 3.2 oz (74.5 kg)    Ideal Body Weight:  75.45  kg  BMI:  Body mass index is 23.56 kg/m.  Estimated Nutritional Needs:   Kcal:  2260-2629 (30-35 cal/kg)  Protein:  89-112 gm (1.2-1.5gm/kg)  Fluid:  >/= 1.9L  EDUCATION NEEDS:   No education needs identified at this time  Satira Anis. Lizzeth Meder, MS, RD LDN Inpatient Clinical Dietitian Pager [phone]
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
## 2016-06-17 NOTE — Plan of Care (Signed)
Problem: Physical Regulation: Goal: Ability to maintain clinical measurements within normal limits will improve Outcome: Progressing Pt irritable throughout the shift. Pt uses foul language. Oxycodone given for chronic back pain with minimal relief. MD notified, 2nd dose of oxycodone given. Pt reports no relief, MD notified. Morphine ordered, but not given, pt was asleep. U/S doppler of LE positive for DVT in R LE. Hgb dropped to 9.5. Heparin drip d/c. Eliquis initiated.

## 2016-06-17 NOTE — Progress Notes (Signed)
*  PRELIMINARY RESULTS* Echocardiogram 2D Echocardiogram has been performed.  Jared Jefferson 06/17/2016, 12:41 PM

## 2016-06-18 ENCOUNTER — Inpatient Hospital Stay: Payer: Medicare Other

## 2016-06-18 DIAGNOSIS — J9601 Acute respiratory failure with hypoxia: Secondary | ICD-10-CM

## 2016-06-18 LAB — MRSA PCR SCREENING: MRSA BY PCR: NEGATIVE

## 2016-06-18 LAB — BLOOD GAS, ARTERIAL
ACID-BASE DEFICIT: 8.6 mmol/L — AB (ref 0.0–2.0)
Acid-base deficit: 13 mmol/L — ABNORMAL HIGH (ref 0.0–2.0)
BICARBONATE: 17.4 mmol/L — AB (ref 20.0–28.0)
Bicarbonate: 17 mmol/L — ABNORMAL LOW (ref 20.0–28.0)
FIO2: 0.5
FIO2: 0.35
MECHVT: 500 mL
MECHVT: 500 mL
Mechanical Rate: 30
O2 SAT: 99.3 %
O2 Saturation: 90.8 %
PATIENT TEMPERATURE: 37
PCO2 ART: 37 mmHg (ref 32.0–48.0)
PEEP/CPAP: 5 cmH2O
PH ART: 7.28 — AB (ref 7.350–7.450)
PO2 ART: 162 mmHg — AB (ref 83.0–108.0)
Patient temperature: 37
RATE: 35 resp/min
pCO2 arterial: 60 mmHg — ABNORMAL HIGH (ref 32.0–48.0)
pH, Arterial: 7.06 — CL (ref 7.350–7.450)
pO2, Arterial: 85 mmHg (ref 83.0–108.0)

## 2016-06-18 LAB — URINE CULTURE: Culture: 60000 — AB

## 2016-06-18 LAB — CBC
HCT: 29.1 % — ABNORMAL LOW (ref 40.0–52.0)
Hemoglobin: 9.3 g/dL — ABNORMAL LOW (ref 13.0–18.0)
MCH: 29 pg (ref 26.0–34.0)
MCHC: 32 g/dL (ref 32.0–36.0)
MCV: 90.7 fL (ref 80.0–100.0)
Platelets: 117 K/uL — ABNORMAL LOW (ref 150–440)
RBC: 3.21 MIL/uL — ABNORMAL LOW (ref 4.40–5.90)
RDW: 16.7 % — ABNORMAL HIGH (ref 11.5–14.5)
WBC: 22.5 K/uL — ABNORMAL HIGH (ref 3.8–10.6)

## 2016-06-18 LAB — BASIC METABOLIC PANEL
ANION GAP: 9 (ref 5–15)
BUN: 34 mg/dL — AB (ref 6–20)
CHLORIDE: 104 mmol/L (ref 101–111)
CO2: 21 mmol/L — ABNORMAL LOW (ref 22–32)
Calcium: 9 mg/dL (ref 8.9–10.3)
Creatinine, Ser: 1.61 mg/dL — ABNORMAL HIGH (ref 0.61–1.24)
GFR calc Af Amer: 49 mL/min — ABNORMAL LOW (ref >60)
GFR calc non Af Amer: 42 mL/min — ABNORMAL LOW (ref >60)
GLUCOSE: 380 mg/dL — AB (ref 65–99)
POTASSIUM: 6.6 mmol/L — AB (ref 3.5–5.1)
Sodium: 134 mmol/L — ABNORMAL LOW (ref 135–145)

## 2016-06-18 LAB — GLUCOSE, CAPILLARY
Glucose-Capillary: 250 mg/dL — ABNORMAL HIGH (ref 65–99)
Glucose-Capillary: 258 mg/dL — ABNORMAL HIGH (ref 65–99)
Glucose-Capillary: 424 mg/dL — ABNORMAL HIGH (ref 65–99)
Glucose-Capillary: 300 mg/dL — ABNORMAL HIGH (ref 65–99)
Glucose-Capillary: 229 mg/dL — ABNORMAL HIGH (ref 65–99)
Glucose-Capillary: 168 mg/dL — ABNORMAL HIGH (ref 65–99)
Glucose-Capillary: 115 mg/dL — ABNORMAL HIGH (ref 65–99)

## 2016-06-18 LAB — LACTIC ACID, PLASMA
Lactic Acid, Venous: 8.8 mmol/L (ref 0.5–1.9)
Lactic Acid, Venous: 4.8 mmol/L (ref 0.5–1.9)

## 2016-06-18 LAB — TROPONIN I: TROPONIN I: 0.35 ng/mL — AB (ref <0.03)

## 2016-06-18 LAB — MAGNESIUM: Magnesium: 2.9 mg/dL — ABNORMAL HIGH (ref 1.7–2.4)

## 2016-06-18 MED ORDER — INSULIN ASPART 100 UNIT/ML ~~LOC~~ SOLN
12.0000 [IU] | Freq: Once | SUBCUTANEOUS | Status: AC
  Administered 2016-06-18: 12 [IU] via SUBCUTANEOUS
  Filled 2016-06-18: qty 12

## 2016-06-18 MED ORDER — INSULIN ASPART 100 UNIT/ML ~~LOC~~ SOLN
0.0000 [IU] | SUBCUTANEOUS | Status: DC
Start: 2016-06-18 — End: 2016-06-19
  Administered 2016-06-18: 7 [IU] via SUBCUTANEOUS
  Administered 2016-06-18: 11 [IU] via SUBCUTANEOUS
  Filled 2016-06-18: qty 7
  Filled 2016-06-18: qty 11

## 2016-06-18 MED ORDER — ACETAMINOPHEN 325 MG PO TABS: 650.0000 mg | ORAL_TABLET | Freq: Four times a day (QID) | ORAL | Status: DC | PRN

## 2016-06-18 MED ORDER — CEFEPIME-DEXTROSE 1 GM/50ML IV SOLR
1.0000 g | Freq: Two times a day (BID) | INTRAVENOUS | Status: DC
  Administered 2016-06-18: 1 g via INTRAVENOUS
  Filled 2016-06-18 (×3): qty 50

## 2016-06-18 MED ORDER — NOREPINEPHRINE BITARTRATE 1 MG/ML IV SOLN
0.0000 ug/min | INTRAVENOUS | Status: DC
  Administered 2016-06-18: 8 ug/min via INTRAVENOUS
  Administered 2016-06-19: 14 ug/min via INTRAVENOUS
  Filled 2016-06-18 (×2): qty 16

## 2016-06-18 MED ORDER — STERILE WATER FOR INJECTION IJ SOLN
INTRAMUSCULAR | Status: AC
  Filled 2016-06-18: qty 10

## 2016-06-18 MED ORDER — ASPIRIN 81 MG PO CHEW
81.0000 mg | CHEWABLE_TABLET | Freq: Every day | ORAL | Status: DC
  Administered 2016-06-18: 81 mg
  Filled 2016-06-18: qty 1

## 2016-06-18 MED ORDER — FENTANYL 2500MCG IN NS 250ML (10MCG/ML) PREMIX INFUSION: 0.0000 ug/h | INTRAVENOUS | Status: DC

## 2016-06-18 MED ORDER — APIXABAN 5 MG PO TABS: 5.0000 mg | ORAL_TABLET | Freq: Two times a day (BID) | ORAL | Status: DC

## 2016-06-18 MED ORDER — PANTOPRAZOLE SODIUM 40 MG IV SOLR: 40.0000 mg | Freq: Every day | INTRAVENOUS | Status: DC

## 2016-06-18 MED ORDER — VECURONIUM BROMIDE 10 MG IV SOLR
10.0000 mg | Freq: Once | INTRAVENOUS | Status: AC
  Administered 2016-06-18: 10 mg via INTRAVENOUS

## 2016-06-18 MED ORDER — ATROPINE SULFATE 1 MG/10ML IJ SOSY
PREFILLED_SYRINGE | INTRAMUSCULAR | Status: AC
  Filled 2016-06-18: qty 10

## 2016-06-18 MED ORDER — METHYLPREDNISOLONE SODIUM SUCC 40 MG IJ SOLR
40.0000 mg | Freq: Two times a day (BID) | INTRAMUSCULAR | Status: DC
  Administered 2016-06-18 – 2016-06-19 (×2): 40 mg via INTRAVENOUS
  Filled 2016-06-18 (×2): qty 1

## 2016-06-18 MED ORDER — ORAL CARE MOUTH RINSE
15.0000 mL | OROMUCOSAL | Status: DC
  Administered 2016-06-18 – 2016-06-19 (×8): 15 mL via OROMUCOSAL

## 2016-06-18 MED ORDER — ALBUTEROL SULFATE (2.5 MG/3ML) 0.083% IN NEBU: 2.5000 mg | INHALATION_SOLUTION | RESPIRATORY_TRACT | Status: DC | PRN

## 2016-06-18 MED ORDER — ORAL CARE MOUTH RINSE
15.0000 mL | Freq: Four times a day (QID) | OROMUCOSAL | Status: DC
  Administered 2016-06-18: 15 mL via OROMUCOSAL

## 2016-06-18 MED ORDER — LORAZEPAM 2 MG/ML IJ SOLN
INTRAMUSCULAR | Status: AC
  Administered 2016-06-18: 4 mg via INTRAVENOUS
  Filled 2016-06-18: qty 2

## 2016-06-18 MED ORDER — LORAZEPAM 2 MG/ML IJ SOLN
4.0000 mg | Freq: Once | INTRAMUSCULAR | Status: AC
Start: 2016-06-18 — End: 2016-06-18
  Administered 2016-06-18: 4 mg via INTRAVENOUS

## 2016-06-18 MED ORDER — INSULIN ASPART 100 UNIT/ML ~~LOC~~ SOLN: 2.0000 [IU] | SUBCUTANEOUS | Status: DC

## 2016-06-18 MED ORDER — VECURONIUM BROMIDE 10 MG IV SOLR
INTRAVENOUS | Status: AC
  Administered 2016-06-18: 10 mg via INTRAVENOUS
  Filled 2016-06-18: qty 10

## 2016-06-18 MED ORDER — IPRATROPIUM-ALBUTEROL 0.5-2.5 (3) MG/3ML IN SOLN
3.0000 mL | Freq: Four times a day (QID) | RESPIRATORY_TRACT | Status: DC
  Administered 2016-06-18 – 2016-06-19 (×5): 3 mL via RESPIRATORY_TRACT
  Filled 2016-06-18 (×5): qty 3

## 2016-06-18 MED ORDER — FAMOTIDINE IN NACL 20-0.9 MG/50ML-% IV SOLN
20.0000 mg | Freq: Two times a day (BID) | INTRAVENOUS | Status: DC
  Administered 2016-06-18 (×2): 20 mg via INTRAVENOUS
  Filled 2016-06-18 (×2): qty 50

## 2016-06-18 MED ORDER — CHLORHEXIDINE GLUCONATE 0.12% ORAL RINSE (MEDLINE KIT)
15.0000 mL | Freq: Two times a day (BID) | OROMUCOSAL | Status: DC
  Administered 2016-06-18 – 2016-06-19 (×3): 15 mL via OROMUCOSAL

## 2016-06-18 MED ORDER — APIXABAN 5 MG PO TABS
10.0000 mg | ORAL_TABLET | Freq: Two times a day (BID) | ORAL | Status: DC
  Administered 2016-06-18: 10 mg
  Filled 2016-06-18: qty 2

## 2016-06-18 MED ORDER — NOREPINEPHRINE BITARTRATE 1 MG/ML IV SOLN
4.0000 ug/min | INTRAVENOUS | Status: DC
  Administered 2016-06-18: 18 ug/min via INTRAVENOUS
  Administered 2016-06-18: 16 ug/min via INTRAVENOUS
  Filled 2016-06-18: qty 4

## 2016-06-18 MED ORDER — CHLORHEXIDINE GLUCONATE 0.12% ORAL RINSE (MEDLINE KIT): 15.0000 mL | Freq: Two times a day (BID) | OROMUCOSAL | Status: DC

## 2016-06-18 MED ORDER — SODIUM CHLORIDE 0.9 % IV SOLN
INTRAVENOUS | Status: DC
  Administered 2016-06-18 – 2016-06-19 (×3): via INTRAVENOUS
  Filled 2016-06-18 (×4): qty 50

## 2016-06-18 NOTE — Progress Notes (Signed)
PHARMACY NOTE:  ANTIMICROBIAL RENAL DOSAGE ADJUSTMENT  Current antimicrobial regimen includes a mismatch between antimicrobial dosage and estimated renal function.  As per policy approved by the Pharmacy & Therapeutics and Medical Executive Committees, the antimicrobial dosage will be adjusted accordingly.  Current antimicrobial dosage:  Cefepime 1 g iv q 8 hours  Indication: HCAP  Renal Function:  Estimated Creatinine Clearance: 44.7 mL/min (by C-G formula based on SCr of 1.61 mg/dL (H)). '[]'$      On intermittent HD, scheduled: '[]'$      On CRRT    Antimicrobial dosage has been changed to:  Cefepime 1 g iv q 12 hours.   Additional comments:   Thank you for allowing pharmacy to be a part of this patient's care.  Napoleon Form, [facility] 06/18/2016 12:34 PM

## 2016-06-18 NOTE — ED Provider Notes (Signed)
-----------------------------------------   4:50 AM on 06/18/2016 -----------------------------------------  All to bedside for a neck arrest. Dr. Marcille Blanco was already running the code. Patient had a V. fib and V. tach arrest. ACLS was performed with my assistance. I did place an EJ in the left neck. I also intubated the patient.  INTUBATION Performed by: Schuyler Amor  Required items: required blood products, implants, devices, and special equipment available Patient identity confirmed: provided demographic data and hospital-assigned identification number Time out: Immediately prior to procedure a "time out" was called to verify the correct patient, procedure, equipment, support staff and site/side marked as required.  Indications: Cardiac arrest   Intubation method: Direct laryngoscopy   Preoxygenation: Positive BVM  Sedatives: 20 Etomidate Paralytic: 100 Succinylcholine  Tube Size: 8 cuffed  Post-procedure assessment: chest rise and ETCO2 monitor Breath sounds: equal and absent over the epigastrium Tube secured with: ETT holder Chest x-ray interpreted by radiologist and me.  Chest x-ray findings: Deferred to hospitalist   Patient tolerated the procedure well with no immediate complications.  He did have return of spontaneous circulation while at the patient was intubated, and going to the ICU. He was under the care of Dr. Marcille Blanco.         Schuyler Amor, MD 06/18/16 [phone]

## 2016-06-18 NOTE — Care Management (Signed)
Patient transferred to ICU from nursing unit this mornining due to cardiac arrest.  He is currently intubated.  Patient is currently followed by [facility] health nurse and physical therapy.  Apparently patient made the decision at last discharge to return to his own home rather than going to [place] where there is more family support.  He presented on 1.27 with shortness of breath.  He has history of lung cancer with livers mets.  He is a full code.

## 2016-06-18 NOTE — Progress Notes (Signed)
   06/18/16 0400  Clinical Encounter Type  Visited With Health care provider  Visit Type Code  Referral From Care management  Consult/Referral To Nurse;Physician  Spiritual Encounters  Spiritual Needs Emotional  Stress Factors  Patient Stress Factors None identified  Family Stress Factors Loss of control   Chaplain was called to a code blue @ 4:42am. Pt was moved to ICU. AC requested for the chaplain to call family. Chaplain called the Pt's brother who was listed under the emergency contact. The brother's name is Alfred Levins. Chaplain called but didn't give any medical information or condition update via phone. Chaplain asked family to come in and the family said they would be on their way shortly.    Thanks,  Dante Gang, Chaplain  Spiritual care

## 2016-06-18 NOTE — Progress Notes (Signed)
Patient being transferred to ICU bed 5. Report called to Sharyn Lull, Therapist, sports.
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
## 2016-06-18 NOTE — Significant Event (Signed)
Goals of care discussion with brother:  His brother who is HCPOA indicates that pt would not wish to be sustained on life support for a prolonged period of time or if there was little chance of a functional recovery. I suggested that we make him DNR now and support for next 48 hrs to see which way things turn. 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 DNR order entered  Merton Border, MD PCCM service Mobile [phone] Pager [phone] 06/18/2016

## 2016-06-18 NOTE — Progress Notes (Signed)
eLink Physician-Brief Progress Note Patient Name: Jared Jefferson DOB: 08/02/1945 MRN: [id number]   Date of Service  06/18/2016  HPI/Events of Note  Agitation - BP = 93/64 on a Norepinephrine IV infusion.   eICU Interventions  Will order: 1. Fentanyl IV infusion. Titrate to RASS = 0.      Intervention Category Intermediate Interventions: Other:  Lysle Dingwall 06/18/2016, 5:19 AM

## 2016-06-18 NOTE — Progress Notes (Signed)
Received call from [place] at [facility] that patient was showing vfib on the monitor. Upon entry to room, patient found unresponsive with faint pulse. Code called and chest compressions initiated.

## 2016-06-18 NOTE — Progress Notes (Signed)
Nutrition Follow-up  DOCUMENTATION CODES:   Non-severe (moderate) malnutrition in context of chronic illness  INTERVENTION:  1. D/C Magic cup TID with meals, each supplement provides 290 kcal and 9 grams of protein 2. If unable to extubate within 24-48 hours, recommend began enteral feeds via OGT with Vital 1.2 @ 30m/hr Provides 1728 calories, 108gm protein, 1168cc free water  NUTRITION DIAGNOSIS:   Malnutrition related to chronic illness as evidenced by severe depletion of body fat, moderate depletions of muscle mass. 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 Will monitor for plan moving forward. Labs and medications reviewed: CBGs 214-424 Solumedrol, Colace Fentanyl gtt, levo gtt (ordered)   Diet Order:  Diet NPO time specified  Skin:  Reviewed, no issues  Last BM:  06/14/2016  Height:   Ht Readings from Last 1 Encounters:  06/20/2016 '5\' 10"'$  (1.778 m)    Weight:   Wt Readings from Last 1 Encounters:  06/18/16 164 lb 3.9 oz (74.5 kg)    Ideal Body Weight:  75.45 kg  BMI:  Body mass index is 23.57 kg/m.  Estimated Nutritional Needs:   Kcal:  1602  Protein:  89-112 gm  Fluid:  >/= 1.9L  EDUCATION NEEDS:   No education needs identified at this time  WSatira Anis Ashleigh Arya, MS, RD LDN Inpatient Clinical  Dietitian Pager [phone]

## 2016-06-18 NOTE — Progress Notes (Signed)
eLink Physician-Brief Progress Note Patient Name: Jared Jefferson DOB: [birth year] MRN: [id number]   Date of Service  06/18/2016  HPI/Events of Note  ABG on 50%/PRVC 20/TV 500/P5 = 7.06/60/85/17  eICU Interventions  Will order: 1. Increase PRVC rate to 35. 2. ABG at 7:30 AM.     Intervention Category Major Interventions: Acid-Base disturbance - evaluation and management;Respiratory failure - evaluation and management  Kristie Bracewell Cornelia Copa 06/18/2016, 6:24 AM

## 2016-06-19 ENCOUNTER — Inpatient Hospital Stay: Payer: Medicare Other

## 2016-06-19 ENCOUNTER — Telehealth: Payer: Self-pay | Admitting: Pulmonary Disease

## 2016-06-19 LAB — GLUCOSE, CAPILLARY
Glucose-Capillary: 102 mg/dL — ABNORMAL HIGH (ref 65–99)
Glucose-Capillary: 70 mg/dL (ref 65–99)
Glucose-Capillary: 32 mg/dL — CL (ref 65–99)
Glucose-Capillary: 24 mg/dL — CL (ref 65–99)

## 2016-06-19 LAB — COMPREHENSIVE METABOLIC PANEL
ALBUMIN: 2.7 g/dL — AB (ref 3.5–5.0)
ALT: 3866 U/L — ABNORMAL HIGH (ref 17–63)
ANION GAP: 11 (ref 5–15)
AST: 6939 U/L — AB (ref 15–41)
Alkaline Phosphatase: 384 U/L — ABNORMAL HIGH (ref 38–126)
BUN: 49 mg/dL — AB (ref 6–20)
CHLORIDE: 102 mmol/L (ref 101–111)
CO2: 22 mmol/L (ref 22–32)
Calcium: 8.3 mg/dL — ABNORMAL LOW (ref 8.9–10.3)
Creatinine, Ser: 3.13 mg/dL — ABNORMAL HIGH (ref 0.61–1.24)
GFR calc Af Amer: 22 mL/min — ABNORMAL LOW (ref >60)
GFR calc non Af Amer: 19 mL/min — ABNORMAL LOW (ref >60)
GLUCOSE: 164 mg/dL — AB (ref 65–99)
POTASSIUM: 6.5 mmol/L — AB (ref 3.5–5.1)
SODIUM: 135 mmol/L (ref 135–145)
TOTAL PROTEIN: 5.2 g/dL — AB (ref 6.5–8.1)
Total Bilirubin: 1.2 mg/dL (ref 0.3–1.2)

## 2016-06-19 LAB — CBC
HCT: 29 % — ABNORMAL LOW (ref 40.0–52.0)
Hemoglobin: 9.1 g/dL — ABNORMAL LOW (ref 13.0–18.0)
MCH: 28.4 pg (ref 26.0–34.0)
MCHC: 31.6 g/dL — AB (ref 32.0–36.0)
MCV: 90.1 fL (ref 80.0–100.0)
Platelets: 78 10*3/uL — ABNORMAL LOW (ref 150–440)
RBC: 3.22 MIL/uL — ABNORMAL LOW (ref 4.40–5.90)
RDW: 16.2 % — AB (ref 11.5–14.5)
WBC: 20.2 10*3/uL — AB (ref 3.8–10.6)

## 2016-06-19 LAB — TROPONIN I: Troponin I: 1.32 ng/mL (ref <0.03)

## 2016-06-19 MED ORDER — MIDAZOLAM HCL 2 MG/2ML IJ SOLN
2.0000 mg | Freq: Once | INTRAMUSCULAR | Status: AC
  Administered 2016-06-19: 2 mg via INTRAVENOUS
  Filled 2016-06-19: qty 2

## 2016-06-19 MED ORDER — SODIUM CHLORIDE 0.9 % IV SOLN
0.0000 mg/h | INTRAVENOUS | Status: DC
  Administered 2016-06-19: 5 mg/h via INTRAVENOUS
  Filled 2016-06-19: qty 10

## 2016-06-19 MED ORDER — DEXTROSE 50 % IV SOLN
1.0000 | Freq: Once | INTRAVENOUS | Status: DC
  Administered 2016-06-19: 50 mL via INTRAVENOUS

## 2016-06-19 MED ORDER — DEXTROSE 50 % IV SOLN
INTRAVENOUS | Status: AC
  Administered 2016-06-19: 50 mL via INTRAVENOUS
  Filled 2016-06-19: qty 50

## 2016-06-19 MED ORDER — MIDAZOLAM BOLUS VIA INFUSION
4.0000 mg | INTRAVENOUS | Status: DC | PRN
  Administered 2016-06-19: 4 mg via INTRAVENOUS
  Filled 2016-06-19: qty 4

## 2016-06-20 NOTE — Telephone Encounter (Signed)
Called and spoke with Jared Jefferson, per him, funeral director, and informed him the death certificate was ready for pick up. He ask that I fax a copy and then stated that he sent an envelope priority that has an envelope for the death certificate for the health dept and ask that I put the 2nd envelope that has the check for the family in there with it an mail it. I informed him that I am not allowed to have any business with dealing with the check/envelope to the family that I can only place the death certificate in and mail it only. He was upset that he was going to have to drive an hour to handle this. I apologized to him and then he stated he would come and pick it all up and do it himself. Informed him death certificate would be up front to pick up.

## 2016-06-20 NOTE — Telephone Encounter (Signed)
Death cert on DS desk. Will call funeral to pick up once complete.

## 2016-06-21 LAB — CULTURE, BLOOD (ROUTINE X 2)
CULTURE: NO GROWTH
Culture: NO GROWTH

## 2016-06-21 NOTE — Telephone Encounter (Signed)
Will route to [facility] Triage pool to f/u on.

## 2016-06-21 NOTE — Telephone Encounter (Signed)
Received Death Certificate.  Placed on Dr. Alva Garnet Desk.  Funeral service requested a copy be faxed to [phone].  Also requesting we take original to Health Department.  Please contact when ready [phone]

## 2016-06-21 NOTE — Discharge Summary (Signed)
DEATH SUMMARY  DATE OF ADMISSION:  Jun 21, 2016  DATE OF DISCHARGE/DEATH:  June 24, 2016  ADMISSION DIAGNOSES:   1) Pneumonia 2) Sepsis Elevated troponin I 4) Hypertension 5) Non-small cell cancer of lung 6) DM 2 7) COPD 8) Chronic back pain  DISCHARGE DIAGNOSES:   1) Pneumonia 2) Sepsis Elevated troponin I 4) Hypertension 5) Non-small cell cancer of lung 6) DM 2 7) COPD 8) Chronic back pain 9) Cardiac arrest (ventricular fibrillation) 01/29 - resuscitated after 15-20 mins ACLS 10) Acute respiratory failure due to cardiac arrest 11) Cardiogenic shock 12) AKI due to shock 13) Hyperkalemia 14) Elevated LFTs - shock liver 15) Chronic anemia without acute blood loss 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 The patient had been taking his antibiotics as prescribed but became increasingly more dyspneic to the point where he was unable to make a few steps across the room. He denies fevers but admits to cough that is occasionally productive of thick white phlegm. He denies chest pain but admits that his upper abdomen as well as right upper quadrant/flank has been tender for the last 2 days. In the emergency department the patient was found to be tachypneic and tachycardic. Leukocytosis and lactic acidosis or concerning for sepsis. Blood cultures were obtained and the patient was started on broad-spectrum antibiotics. Troponin was also found to be elevated although the patient has not had EKG changes indicating ischemia. For management of the aforementioned problems the emergency department staff called the hospitalist service for admission.  HOSPITAL  COURSE:   Admitted by Hospitalists. See admission note for initial plan. Suffered VF cardiac arrest in early morning of 06/18/16. Was intubated and hemodynamically stabilized with vasopressors under the direction of Dr Mortimer Fries. Goals of care discussion was had 01/29 with pt's brother and only living first degree relative. Please refer to documentation of that conversation. Pt was made DNR with no escalation of care or support. Pt became progressively hypotensive and bradycardic progressing to asystole in the morning of 06-24-2022. 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

## 2016-06-21 DEATH — deceased

## 2016-06-22 NOTE — Telephone Encounter (Signed)
Nurse, learning disability picked up Death Certificate and unopened priority envelope.

## 2016-07-19 DEATH — deceased

## 2016-08-30 ENCOUNTER — Other Ambulatory Visit: Payer: Self-pay | Admitting: Nurse Practitioner

## 2018-03-26 IMAGING — DX DG CHEST 1V
1 series · 1 of 1 positions shown · non-contrast
Comparison: 08/12/2015 PET-CT.  07/17/2015 chest radiograph.

CLINICAL DATA: 70 y/o  M; altered mental status.

EXAM:
CHEST 1 VIEW

[chest ap]
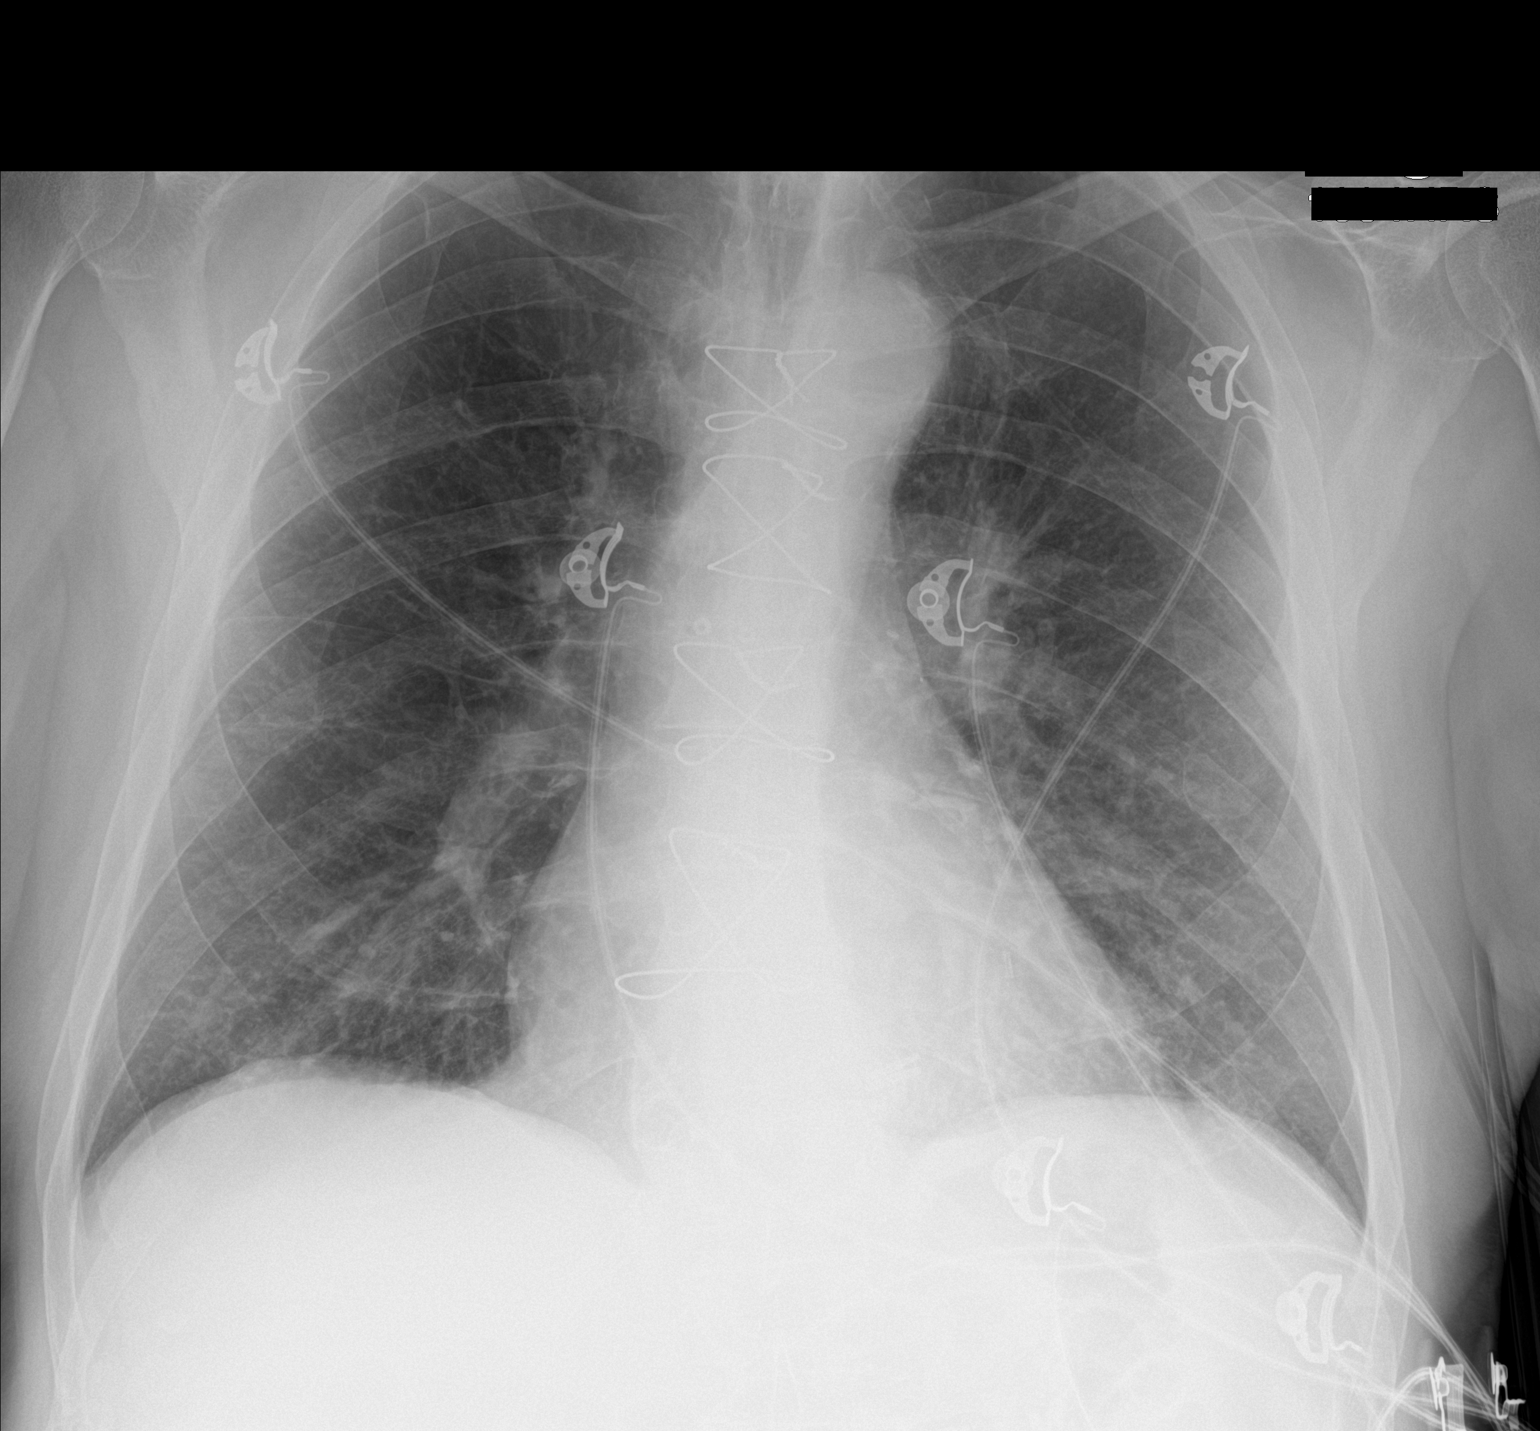

[1 of 1 positions shown; findings below may reference images not displayed]

FINDINGS: Stable cardiac silhouette within normal limits. Status post median
sternotomy. Aortic atherosclerosis with calcifications. No focal
consolidation. There greater than right lung base reticulonodular
opacities. No pleural effusion or pneumothorax. Right upper lobe
pulmonary nodule is better characterized on prior CT and not clearly
evident on radiograph. No acute osseous abnormality is identified.
IMPRESSION: Left greater than right lung base reticulonodular opacities may
represent atypical pneumonia or bronchitis. No focal consolidation.
Pulmonary nodule on prior CT is visible radiographically. Aortic
atherosclerosis.

By: Luz Santhosh M.D.
